# Patient Record
Sex: Male | Born: 1970 | Race: Black or African American | Hispanic: No | Marital: Single | Smoking: Never smoker
Health system: Southern US, Community
[De-identification: ages and names within clinical notes are randomized; demographics above are authoritative.]

## PROBLEM LIST (undated history)

## (undated) HISTORY — PX: ANKLE SURGERY: SHX546

---

## 2006-07-29 DIAGNOSIS — Z0000 Encounter for general adult medical examination without abnormal findings: Secondary | ICD-10-CM | POA: Insufficient documentation

## 2006-07-29 DIAGNOSIS — Z309 Encounter for contraceptive management, unspecified: Secondary | ICD-10-CM | POA: Insufficient documentation

## 2006-07-29 DIAGNOSIS — K922 Gastrointestinal hemorrhage, unspecified: Secondary | ICD-10-CM | POA: Insufficient documentation

## 2006-08-19 DIAGNOSIS — E7800 Pure hypercholesterolemia, unspecified: Secondary | ICD-10-CM | POA: Insufficient documentation

## 2008-05-09 DIAGNOSIS — J029 Acute pharyngitis, unspecified: Secondary | ICD-10-CM | POA: Insufficient documentation

## 2012-10-18 ENCOUNTER — Ambulatory Visit: Payer: Commercial Indemnity | Attending: Orthopaedic Surgery | Admitting: Physical Therapy

## 2012-10-18 DIAGNOSIS — M25676 Stiffness of unspecified foot, not elsewhere classified: Secondary | ICD-10-CM | POA: Insufficient documentation

## 2012-10-18 DIAGNOSIS — M25673 Stiffness of unspecified ankle, not elsewhere classified: Secondary | ICD-10-CM | POA: Insufficient documentation

## 2012-10-18 DIAGNOSIS — IMO0001 Reserved for inherently not codable concepts without codable children: Secondary | ICD-10-CM | POA: Insufficient documentation

## 2012-10-18 DIAGNOSIS — R269 Unspecified abnormalities of gait and mobility: Secondary | ICD-10-CM | POA: Insufficient documentation

## 2012-10-18 DIAGNOSIS — M25579 Pain in unspecified ankle and joints of unspecified foot: Secondary | ICD-10-CM | POA: Insufficient documentation

## 2012-10-20 ENCOUNTER — Ambulatory Visit: Payer: Commercial Indemnity | Admitting: Physical Therapy

## 2012-10-24 ENCOUNTER — Ambulatory Visit: Payer: Commercial Indemnity | Admitting: Rehabilitation

## 2012-10-27 ENCOUNTER — Ambulatory Visit: Payer: Commercial Indemnity | Admitting: Physical Therapy

## 2012-10-31 ENCOUNTER — Ambulatory Visit: Payer: Commercial Indemnity | Admitting: Rehabilitation

## 2012-11-03 ENCOUNTER — Ambulatory Visit: Payer: Commercial Indemnity | Admitting: Rehabilitation

## 2012-11-07 ENCOUNTER — Ambulatory Visit: Payer: Commercial Indemnity | Attending: Orthopaedic Surgery | Admitting: Physical Therapy

## 2012-11-07 DIAGNOSIS — M25676 Stiffness of unspecified foot, not elsewhere classified: Secondary | ICD-10-CM | POA: Insufficient documentation

## 2012-11-07 DIAGNOSIS — IMO0001 Reserved for inherently not codable concepts without codable children: Secondary | ICD-10-CM | POA: Insufficient documentation

## 2012-11-07 DIAGNOSIS — M25579 Pain in unspecified ankle and joints of unspecified foot: Secondary | ICD-10-CM | POA: Insufficient documentation

## 2012-11-07 DIAGNOSIS — M25673 Stiffness of unspecified ankle, not elsewhere classified: Secondary | ICD-10-CM | POA: Insufficient documentation

## 2012-11-07 DIAGNOSIS — R269 Unspecified abnormalities of gait and mobility: Secondary | ICD-10-CM | POA: Insufficient documentation

## 2012-11-10 ENCOUNTER — Ambulatory Visit: Payer: Commercial Indemnity | Admitting: Rehabilitation

## 2012-11-14 ENCOUNTER — Ambulatory Visit: Payer: Commercial Indemnity | Admitting: Physical Therapy

## 2012-11-17 ENCOUNTER — Ambulatory Visit: Payer: Commercial Indemnity | Admitting: Physical Therapy

## 2012-11-21 ENCOUNTER — Ambulatory Visit: Payer: Commercial Indemnity | Admitting: Physical Therapy

## 2012-11-22 ENCOUNTER — Ambulatory Visit: Payer: Commercial Indemnity | Admitting: Physical Therapy

## 2012-11-24 ENCOUNTER — Ambulatory Visit: Payer: Commercial Indemnity | Admitting: Physical Therapy

## 2012-11-28 ENCOUNTER — Ambulatory Visit: Payer: Commercial Indemnity | Admitting: Physical Therapy

## 2012-11-30 ENCOUNTER — Other Ambulatory Visit (HOSPITAL_BASED_OUTPATIENT_CLINIC_OR_DEPARTMENT_OTHER): Payer: Self-pay | Admitting: Orthopaedic Surgery

## 2012-11-30 ENCOUNTER — Ambulatory Visit (HOSPITAL_BASED_OUTPATIENT_CLINIC_OR_DEPARTMENT_OTHER)
Admission: RE | Admit: 2012-11-30 | Discharge: 2012-11-30 | Disposition: A | Discharge status: Home / Self Care | Payer: Commercial Indemnity | Source: Ambulatory Visit | Attending: Orthopaedic Surgery | Admitting: Orthopaedic Surgery

## 2012-11-30 DIAGNOSIS — S8290XD Unspecified fracture of unspecified lower leg, subsequent encounter for closed fracture with routine healing: Secondary | ICD-10-CM | POA: Insufficient documentation

## 2012-11-30 DIAGNOSIS — M898X9 Other specified disorders of bone, unspecified site: Secondary | ICD-10-CM | POA: Insufficient documentation

## 2012-11-30 DIAGNOSIS — M25473 Effusion, unspecified ankle: Secondary | ICD-10-CM | POA: Insufficient documentation

## 2012-11-30 DIAGNOSIS — IMO0002 Reserved for concepts with insufficient information to code with codable children: Secondary | ICD-10-CM

## 2012-11-30 DIAGNOSIS — Z8781 Personal history of (healed) traumatic fracture: Secondary | ICD-10-CM | POA: Insufficient documentation

## 2012-11-30 DIAGNOSIS — S82899B Other fracture of unspecified lower leg, initial encounter for open fracture type I or II: Secondary | ICD-10-CM

## 2012-11-30 DIAGNOSIS — M25476 Effusion, unspecified foot: Secondary | ICD-10-CM | POA: Insufficient documentation

## 2012-11-30 DIAGNOSIS — R609 Edema, unspecified: Secondary | ICD-10-CM | POA: Insufficient documentation

## 2012-12-01 ENCOUNTER — Ambulatory Visit: Payer: Commercial Indemnity | Admitting: Physical Therapy

## 2012-12-05 ENCOUNTER — Ambulatory Visit: Payer: Commercial Indemnity | Attending: Orthopaedic Surgery | Admitting: Physical Therapy

## 2012-12-05 DIAGNOSIS — M25579 Pain in unspecified ankle and joints of unspecified foot: Secondary | ICD-10-CM | POA: Insufficient documentation

## 2012-12-05 DIAGNOSIS — R269 Unspecified abnormalities of gait and mobility: Secondary | ICD-10-CM | POA: Insufficient documentation

## 2012-12-05 DIAGNOSIS — M25673 Stiffness of unspecified ankle, not elsewhere classified: Secondary | ICD-10-CM | POA: Insufficient documentation

## 2012-12-05 DIAGNOSIS — IMO0001 Reserved for inherently not codable concepts without codable children: Secondary | ICD-10-CM | POA: Insufficient documentation

## 2012-12-05 DIAGNOSIS — M25676 Stiffness of unspecified foot, not elsewhere classified: Secondary | ICD-10-CM | POA: Insufficient documentation

## 2012-12-13 ENCOUNTER — Ambulatory Visit: Payer: Commercial Indemnity | Admitting: Physical Therapy

## 2012-12-15 ENCOUNTER — Ambulatory Visit: Payer: Commercial Indemnity | Admitting: Physical Therapy

## 2012-12-19 ENCOUNTER — Ambulatory Visit: Payer: Commercial Indemnity | Admitting: Physical Therapy

## 2012-12-21 ENCOUNTER — Ambulatory Visit: Payer: Commercial Indemnity | Admitting: Physical Therapy

## 2012-12-26 ENCOUNTER — Ambulatory Visit: Payer: Commercial Indemnity | Admitting: Rehabilitation

## 2012-12-28 ENCOUNTER — Ambulatory Visit: Payer: Commercial Indemnity | Admitting: Physical Therapy

## 2013-01-02 ENCOUNTER — Ambulatory Visit: Payer: Commercial Indemnity | Admitting: Physical Therapy

## 2013-01-04 ENCOUNTER — Ambulatory Visit: Payer: Commercial Indemnity | Admitting: Rehabilitation
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
## 2015-08-15 ENCOUNTER — Encounter (HOSPITAL_BASED_OUTPATIENT_CLINIC_OR_DEPARTMENT_OTHER): Payer: Self-pay | Admitting: *Deleted

## 2015-08-15 ENCOUNTER — Emergency Department (HOSPITAL_BASED_OUTPATIENT_CLINIC_OR_DEPARTMENT_OTHER): Payer: Managed Care, Other (non HMO)

## 2015-08-15 ENCOUNTER — Emergency Department (HOSPITAL_BASED_OUTPATIENT_CLINIC_OR_DEPARTMENT_OTHER)
Admission: EM | Admit: 2015-08-15 | Discharge: 2015-08-15 | Disposition: A | Discharge status: Home / Self Care | Payer: Managed Care, Other (non HMO) | Attending: Emergency Medicine | Admitting: Emergency Medicine

## 2015-08-15 DIAGNOSIS — W010XXA Fall on same level from slipping, tripping and stumbling without subsequent striking against object, initial encounter: Secondary | ICD-10-CM | POA: Diagnosis not present

## 2015-08-15 DIAGNOSIS — Y9289 Other specified places as the place of occurrence of the external cause: Secondary | ICD-10-CM | POA: Diagnosis not present

## 2015-08-15 DIAGNOSIS — Y998 Other external cause status: Secondary | ICD-10-CM | POA: Insufficient documentation

## 2015-08-15 DIAGNOSIS — S9002XA Contusion of left ankle, initial encounter: Secondary | ICD-10-CM | POA: Diagnosis not present

## 2015-08-15 DIAGNOSIS — W19XXXA Unspecified fall, initial encounter: Secondary | ICD-10-CM

## 2015-08-15 DIAGNOSIS — Y9389 Activity, other specified: Secondary | ICD-10-CM | POA: Diagnosis not present

## 2015-08-15 DIAGNOSIS — S93402A Sprain of unspecified ligament of left ankle, initial encounter: Secondary | ICD-10-CM | POA: Diagnosis not present

## 2015-08-15 DIAGNOSIS — S99912A Unspecified injury of left ankle, initial encounter: Secondary | ICD-10-CM | POA: Diagnosis present

## 2015-08-15 MED ORDER — OXYCODONE-ACETAMINOPHEN 5-325 MG PO TABS
1.0000 | ORAL_TABLET | Freq: Once | ORAL | Status: AC
  Administered 2015-08-15: 1 via ORAL
  Filled 2015-08-15: qty 1

## 2015-08-15 MED ORDER — OXYCODONE-ACETAMINOPHEN 7.5-325 MG PO TABS
1.0000 | ORAL_TABLET | Freq: Once | ORAL | Status: DC
  Filled 2015-08-15: qty 1

## 2015-08-15 NOTE — Discharge Instructions (Signed)
Ankle Sprain Follow-up with orthopedics. An ankle sprain is an injury to the strong, fibrous tissues (ligaments) that hold the bones of your ankle joint together.  CAUSES An ankle sprain is usually caused by a fall or by twisting your ankle. Ankle sprains most commonly occur when you step on the outer edge of your foot, and your ankle turns inward. People who participate in sports are more prone to these types of injuries.  SYMPTOMS   Pain in your ankle. The pain may be present at rest or only when you are trying to stand or walk.  Swelling.  Bruising. Bruising may develop immediately or within 1 to 2 days after your injury.  Difficulty standing or walking, particularly when turning corners or changing directions. DIAGNOSIS  Your caregiver will ask you details about your injury and perform a physical exam of your ankle to determine if you have an ankle sprain. During the physical exam, your caregiver will press on and apply pressure to specific areas of your foot and ankle. Your caregiver will try to move your ankle in certain ways. An X-ray exam may be done to be sure a bone was not broken or a ligament did not separate from one of the bones in your ankle (avulsion fracture).  TREATMENT  Certain types of braces can help stabilize your ankle. Your caregiver can make a recommendation for this. Your caregiver may recommend the use of medicine for pain. If your sprain is severe, your caregiver may refer you to a surgeon who helps to restore function to parts of your skeletal system (orthopedist) or a physical therapist. HOME CARE INSTRUCTIONS   Apply ice to your injury for 1-2 days or as directed by your caregiver. Applying ice helps to reduce inflammation and pain.  Put ice in a plastic bag.  Place a towel between your skin and the bag.  Leave the ice on for 15-20 minutes at a time, every 2 hours while you are awake.  Only take over-the-counter or prescription medicines for pain,  discomfort, or fever as directed by your caregiver.  Elevate your injured ankle above the level of your heart as much as possible for 2-3 days.  If your caregiver recommends crutches, use them as instructed. Gradually put weight on the affected ankle. Continue to use crutches or a cane until you can walk without feeling pain in your ankle.  If you have a plaster splint, wear the splint as directed by your caregiver. Do not rest it on anything harder than a pillow for the first 24 hours. Do not put weight on it. Do not get it wet. You may take it off to take a shower or bath.  You may have been given an elastic bandage to wear around your ankle to provide support. If the elastic bandage is too tight (you have numbness or tingling in your foot or your foot becomes cold and blue), adjust the bandage to make it comfortable.  If you have an air splint, you may blow more air into it or let air out to make it more comfortable. You may take your splint off at night and before taking a shower or bath. Wiggle your toes in the splint several times per day to decrease swelling. SEEK MEDICAL CARE IF:   You have rapidly increasing bruising or swelling.  Your toes feel extremely cold or you lose feeling in your foot.  Your pain is not relieved with medicine. SEEK IMMEDIATE MEDICAL CARE IF:  Your toes are  numb or blue.  You have severe pain that is increasing. MAKE SURE YOU:   Understand these instructions.  Will watch your condition.  Will get help right away if you are not doing well or get worse.   This information is not intended to replace advice given to you by your health care provider. Make sure you discuss any questions you have with your health care provider.   Document Released: 05/24/2005 Document Revised: 06/14/2014 Document Reviewed: 06/05/2011 Elsevier Interactive Patient Education [employer]
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
## 2015-08-15 NOTE — ED Notes (Signed)
He fell yesterday. Pain in his left ankle since the fall.

## 2015-08-19 ENCOUNTER — Encounter: Payer: Self-pay | Admitting: Family Medicine

## 2015-08-19 ENCOUNTER — Ambulatory Visit (INDEPENDENT_AMBULATORY_CARE_PROVIDER_SITE_OTHER): Payer: Self-pay | Admitting: Family Medicine

## 2015-08-19 VITALS — BP 128/72 | HR 78 | Ht 72.0 in | Wt 178.0 lb

## 2015-08-19 DIAGNOSIS — S99912A Unspecified injury of left ankle, initial encounter: Secondary | ICD-10-CM

## 2015-08-19 NOTE — Patient Instructions (Signed)
While you do have an ankle sprain your primary issue is a flare of your advanced arthritis in this ankle. Dr. Lajoyce Cornersuda and Dr. Victorino DikeHewitt are the specialists around here I would consider seeing if you get to the point you want something done surgically. These are the 4 classes of medicine you can take for this: Tylenol 500mg  1-2 tabs three times a day for pain. Aleve 1-2 tabs twice a day with food Glucosamine sulfate 750mg  twice a day is a supplement that may help. Capsaicin, aspercreme, or biofreeze topically up to four times a day may also help with pain. Cortisone injections are an option - call me if you want to do one of these. ACE wrap for compression and support. Consider the boot also in the short term. It's important that you continue to stay active. Consider physical therapy to strengthen muscles around the joint that hurts to take pressure off of the joint itself. Shoe inserts with good arch support may be helpful. Walker or cane if needed. Heat or ice 15 minutes at a time 3-4 times a day as needed to help with pain. Follow up with me in 1 month.

## 2015-08-21 DIAGNOSIS — S99912A Unspecified injury of left ankle, initial encounter: Secondary | ICD-10-CM | POA: Insufficient documentation

## 2015-08-21 NOTE — Assessment & Plan Note (Signed)
in setting of multiple surgeries.  Independently reviewed radiographs - has fusion of distal tibia and fibula, advanced DJD of ankle joint.  No ligamentous laxity on exam.  His pain is acute on chronic and he likely flared the advanced arthritis of this ankle.  Discussed tylenol, nsaids, glucosamine, topical medications.  Consider intraarticular injection.  ACE wrap for compression, boot only if needed.  Heat/ice.  F/u in 1 month.
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
## 2015-08-26 ENCOUNTER — Telehealth: Payer: Self-pay | Admitting: Family Medicine

## 2015-08-27 NOTE — Telephone Encounter (Signed)
He is on the schedule for an injection in the morning - we will discuss at that time.

## 2015-08-28 ENCOUNTER — Ambulatory Visit (INDEPENDENT_AMBULATORY_CARE_PROVIDER_SITE_OTHER): Payer: Managed Care, Other (non HMO) | Admitting: Family Medicine

## 2015-08-28 ENCOUNTER — Encounter: Payer: Self-pay | Admitting: Family Medicine

## 2015-08-28 VITALS — BP 134/85 | HR 74 | Ht 72.0 in | Wt 179.0 lb

## 2015-08-28 DIAGNOSIS — S99912A Unspecified injury of left ankle, initial encounter: Secondary | ICD-10-CM

## 2015-08-28 MED ORDER — METHYLPREDNISOLONE ACETATE 40 MG/ML IJ SUSP
40.0000 mg | Freq: Once | INTRAMUSCULAR | Status: AC
  Administered 2015-08-28: 40 mg via INTRA_ARTICULAR

## 2015-08-28 NOTE — Patient Instructions (Signed)
Call me in a week to let me know how you're doing. I will likely be in touch with you sooner though - I need to review your medical records then call you to talk about next steps, work status, referral.

## 2015-08-29 NOTE — Assessment & Plan Note (Signed)
in setting of multiple surgeries.  Independently reviewed radiographs - has fusion of distal tibia and fibula, advanced DJD of ankle joint.  No ligamentous laxity on exam.  His pain is acute on chronic and he likely flared the advanced arthritis of this ankle.  Intraarticular injection given today.  Discussed tylenol, nsaids, glucosamine, topical medications.  Consider intraarticular injection.  ACE wrap for compression, boot only if needed.  Heat/ice.  F/u in 1 month.  He is inquiring about a letter stating he is out of work due do disability.  He dropped off paperwork for me to review from his surgeons.  After informed written consent patient was seated on exam table.  Lateral aspect left ankle joint identified with ultrasound and marked.  Alcohol swab used to prep area then ankle joint injected with 2:1 marcaine: depomedrol.  Patient tolerated procedure well without immediate complications.

## 2015-08-29 NOTE — Progress Notes (Signed)
PCP: No PCP Per Patient  Subjective:   HPI: Patient is a 45 y.o. male here for left ankle injury.  3/14: Patient has history of severe left ankle fracture - s/p 5 surgeries. Never completely pain free after this. Recommendation from orthopedics around that time was ankle fusion following 'rebreaking' tibia and fibula (radiographs show fusion distally between the two) if he chose to pursue additional treatment. Then on 3/9 he was taking out the trash and inverted his left ankle. + swelling. Has been icing, elevating, taking aleve or advil, using topical medications, percocet. Pain level is 8/10, sharp.  3/23: Patient returns today for intraarticular ankle injection.  No past medical history on file.  No current outpatient prescriptions on file prior to visit.   No current facility-administered medications on file prior to visit.    Past Surgical History  Procedure Laterality Date  . Ankle surgery      No Known Allergies  Social History   Social History  . Marital Status: Single    Spouse Name: N/A  . Number of Children: N/A  . Years of Education: N/A   Occupational History  . Not on file.   Social History Main Topics  . Smoking status: Never Smoker   . Smokeless tobacco: Not on file  . Alcohol Use: No  . Drug Use: No  . Sexual Activity: Not on file   Other Topics Concern  . Not on file   Social History Narrative    No family history on file.  BP 134/85 mmHg  Pulse 74  Ht 6' (1.829 m)  Wt 179 lb (81.194 kg)  BMI 24.27 kg/m2  Review of Systems: See HPI above.    Objective:  Physical Exam:  Gen: NAD, comfortable in exam room  Exam not repeated today. Left ankle: Soft tissue swelling anteriorly lower leg (location of skin graft).  No bruising, other swelling around the ankle. Very limited motion all directions. TTP diffusely anterior ankle joint, fibula, tibia distally.  No other tenderness. Negative ant drawer and talar tilt.   Negative  syndesmotic compression. Thompsons test negative. NV intact distally.  Right ankle: FROM without pain.    Assessment & Plan:  1. Left ankle injury - in setting of multiple surgeries.  Independently reviewed radiographs - has fusion of distal tibia and fibula, advanced DJD of ankle joint.  No ligamentous laxity on exam.  His pain is acute on chronic and he likely flared the advanced arthritis of this ankle.  Intraarticular injection given today.  Discussed tylenol, nsaids, glucosamine, topical medications.  Consider intraarticular injection.  ACE wrap for compression, boot only if needed.  Heat/ice.  F/u in 1 month.  He is inquiring about a letter stating he is out of work due do disability.  He dropped off paperwork for me to review from his surgeons.  After informed written consent patient was seated on exam table.  Lateral aspect left ankle joint identified with ultrasound and marked.  Alcohol swab used to prep area then ankle joint injected with 2:1 marcaine: depomedrol.  Patient tolerated procedure well without immediate complications.

## 2015-09-04 ENCOUNTER — Encounter: Payer: Self-pay | Admitting: Family Medicine

## 2015-09-04 NOTE — Progress Notes (Signed)
Patient ID: Timothy Pace, male   DOB: 1970/11/15, 45 y.o.   MRN: [id number]  Patient dropped off paperwork pertaining to his left lower leg/ankle injuries and surgeries.  I reviewed these then discussed them with patient.  Initial injury was a left open pilon fracture.  He'd had Ladona Ridgelaylor spatial frame, external fixator, ORIF, I&D x 2, flap placement procedures done on this lower leg.  Last procedures performed 07/13/12 included tissue transfer, removal of 4 wires, removal of Taylor spatial frame, other hardware, and debridement of distal tibia.  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  Discussed possible future joint fusion in May 2014.  Ct scan from 11/30/2012 showed healing distal tibia fracture, healed distal fibula fracture.  His last visit with Dr. Renette ButtersGolden with orthopedics was 04/10/2013 - at this point patient's pain was still improving and radiographs showed continued fracture healing.  They discussed tibiotalar fusion for pain control though he opted not to move forward with this at that time.  Patient instructed me he then lost his insurance and did not see a physician for this up until his recent visit with ankle reinjury in the ED.  There is no mention of impairment, work status in records.  Based on records, his examination, limited motion, degree of arthritis I recommend he only work in a job where he is completing either sedentary or light duties.  He would only be able to stand/walk for approximately 15 minutes every 3-4 hours, a desk job would be strongly recommended.  Continue use of a cane or walker regularly to help with stability.  Regarding impairment - with or without an ankle fusion should he move forward with this - his greatest level of whole person impairment using functional measures would be 20% as he requires the use of a cane.  Using degree of arthritis, range of motion would be less than this and these cannot be combined.

## 2016-12-31 ENCOUNTER — Encounter: Payer: Self-pay | Admitting: Family Medicine

## 2016-12-31 ENCOUNTER — Ambulatory Visit (INDEPENDENT_AMBULATORY_CARE_PROVIDER_SITE_OTHER): Payer: Self-pay | Admitting: Family Medicine

## 2016-12-31 DIAGNOSIS — S99912D Unspecified injury of left ankle, subsequent encounter: Secondary | ICD-10-CM

## 2016-12-31 NOTE — Patient Instructions (Signed)
Your pain is due to arthritis. These are the different medications you can take for this: Tylenol 500mg  1-2 tabs three times a day for pain. Capsaicin, aspercreme, or biofreeze topically up to four times a day may also help with pain. Some supplements that may help for arthritis: Boswellia extract, curcumin, pycnogenol Aleve 1-2 tabs twice a day with food OR ibuprofen 800mg  three times a day with food. Cortisone injections are an option but these have not worked for you. Shoe inserts with good arch support may be helpful. Heat or ice 15 minutes at a time 3-4 times a day as needed to help with pain. Let me know if you want to see an ankle specialist to discuss fusion or pain physician. I'd encourage you to fill out the [facility] Coverage.

## 2017-01-01 NOTE — Progress Notes (Signed)
PCP: Patient, No Pcp Per  Subjective:   HPI: Patient is a 46 y.o. male here for left ankle injury.  3/14: Patient has history of severe left ankle fracture - s/p 5 surgeries. Never completely pain free after this. Recommendation from orthopedics around that time was ankle fusion following 'rebreaking' tibia and fibula (radiographs show fusion distally between the two) if he chose to pursue additional treatment. Then on 3/9 he was taking out the trash and inverted his left ankle. + swelling. Has been icing, elevating, taking aleve or advil, using topical medications, percocet. Pain level is 8/10, sharp.  08/28/15: Patient returns today for intraarticular ankle injection.  12/31/16: Patient reports continued problems with left ankle. Pain is 5/10 at rest but up to 10/10 and sharp by end of day anterior ankle. He takes advil or aleve, percocet. Tried some topical rubs also. Cortisone injection did not help - states in short term pain worsened. Pain worse with walking and standing. Despite restriction letter patient reports was denied disability and had to get a job at [employer] working about 4 hours a day - is standing. No new skin changes  No past medical history on file.  No current outpatient prescriptions on file prior to visit.   No current facility-administered medications on file prior to visit.     Past Surgical History:  Procedure Laterality Date  . ANKLE SURGERY      No Known Allergies  Social History   Social History  . Marital status: Single    Spouse name: N/A  . Number of children: N/A  . Years of education: N/A   Occupational History  . Not on file.   Social History Main Topics  . Smoking status: Never Smoker  . Smokeless tobacco: Never Used  . Alcohol use No  . Drug use: No  . Sexual activity: Not on file   Other Topics Concern  . Not on file   Social History Narrative  . No narrative on file    No family history on file.  BP 134/82   Pulse  84   Ht 6' (1.829 m)   Wt 165 lb (74.8 kg)   BMI 22.38 kg/m   Review of Systems: See HPI above.    Objective:  Physical Exam:  Gen: NAD, comfortable in exam room  Left ankle: Persistent soft tissue swelling anteriorly lower leg (location of skin graft).  No bruising, other swelling around the ankle.  Scars well healed. Very limited motion all directions. TTP diffusely anterior ankle joint, fibula, tibia distally.  No other tenderness. Negative ant drawer and talar tilt.   Negative syndesmotic compression. Thompsons test negative. NV intact distally.  Right ankle: FROM without pain.    Assessment & Plan:  1. Left ankle injury - in setting of multiple surgeries.  Radiographs last year showed fusion of distal tibia and fibula, advanced DJD of ankle joint.  No improvement following intraarticular injection of ankle.  He has tried aleve, advil, percocet, topical medications.  Discussed tylenol, nsaids, supplements, other topical medications.  Letter provided again.  Encouraged to fill out [facility] Coverage.  I think his options are limited unfortunately and he would benefit from ankle fusion though I suspect he will always live with some level of pain.

## 2017-01-01 NOTE — Assessment & Plan Note (Signed)
in setting of multiple surgeries.  Radiographs last year showed fusion of distal tibia and fibula, advanced DJD of ankle joint.  No improvement following intraarticular injection of ankle.  He has tried aleve, advil, percocet, topical medications.  Discussed tylenol, nsaids, supplements, other topical medications.  Letter provided again.  Encouraged to fill out [facility] Coverage.  I think his options are limited unfortunately and he would benefit from ankle fusion though I suspect he will always live with some level of pain.

## 2017-11-16 ENCOUNTER — Ambulatory Visit: Payer: BLUE CROSS/BLUE SHIELD | Admitting: Podiatry

## 2017-11-16 ENCOUNTER — Encounter: Payer: Self-pay | Admitting: Podiatry

## 2017-11-16 ENCOUNTER — Ambulatory Visit (INDEPENDENT_AMBULATORY_CARE_PROVIDER_SITE_OTHER): Payer: BLUE CROSS/BLUE SHIELD

## 2017-11-16 DIAGNOSIS — M19072 Primary osteoarthritis, left ankle and foot: Secondary | ICD-10-CM

## 2017-11-16 DIAGNOSIS — M779 Enthesopathy, unspecified: Secondary | ICD-10-CM

## 2017-11-16 MED ORDER — DICLOFENAC SODIUM 75 MG PO TBEC: 75.0000 mg | DELAYED_RELEASE_TABLET | Freq: Two times a day (BID) | ORAL | 1 refills | Status: DC

## 2017-11-16 MED ORDER — METHYLPREDNISOLONE 4 MG PO TBPK
ORAL_TABLET | ORAL | 0 refills | Status: DC
Start: 2017-11-16 — End: 2018-01-20

## 2017-11-16 NOTE — Progress Notes (Signed)
   HPI: 47 year old male with a history of a traumatic open ankle fracture to the left ankle approximately 5 years ago presents today for further evaluation and treatment.  He has had multiple surgeries to the left ankle.  Previous surgeons have told him that at one point he will need to have an ankle arthrodesis.  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  He currently is wearing an ankle brace and good supportive shoes however they have not alleviating symptoms.  Pain is currently 6/10.  He states that the pain keeps him up at night and he experiences tingling throbbing and burning sensations with swelling.  It is sore and tender to touch.  He presents today for further treatment and evaluation and a second opinion regarding best management for the left ankle  History reviewed. No pertinent past medical history.   Physical Exam: General: The patient is alert and oriented x3 in no acute distress.  Dermatology: Skin is warm, dry and supple bilateral lower extremities. Negative for open lesions or macerations.  Vascular: Palpable pedal pulses bilaterally. No edema or erythema noted. Capillary refill within normal limits.  Neurological: Epicritic and protective threshold grossly intact bilaterally.   Musculoskeletal Exam: There is approximately 10 degrees of plantarflexion of 5 degrees of dorsiflexion with a total range of motion of about 15 degrees to the left ankle.  Is very limited and painful with range of motion and to palpation.  Evidence of multiple muscle flaps and grafting around the left ankle.  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  End-stage DJD and degenerative changes, posttraumatic arthritis, noted to the left ankle joint.  There is some joint narrowing of the subtalar joint as well.  Assessment: 1.  Posttraumatic arthrosis of the left ankle   Plan of Care:  1. Patient  evaluated. X-Rays reviewed.  2.  I informed the patient that I do agree with prior surgeons that he will require at one point a tibiotalar arthrodesis of the left ankle joint.  In the meantime the patient can continue oral anti-inflammatories and ankle bracing to help support the ankle. 3.  Prescription for Medrol Dosepak 4.  Prescription for diclofenac 75 mg twice daily 5.  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 Peityn Payton, DPM [facility]  Dr. Felecia ShellingBrent M. Dhairya Corales, DPM    [address]                                        [place], [place] [address]                Office [phone]  Fax [phone]

## 2017-11-21 ENCOUNTER — Ambulatory Visit: Payer: BLUE CROSS/BLUE SHIELD | Admitting: Podiatry

## 2018-01-20 ENCOUNTER — Ambulatory Visit (INDEPENDENT_AMBULATORY_CARE_PROVIDER_SITE_OTHER): Payer: BLUE CROSS/BLUE SHIELD | Admitting: Podiatry

## 2018-01-20 ENCOUNTER — Encounter: Payer: Self-pay | Admitting: Podiatry

## 2018-01-20 DIAGNOSIS — M19072 Primary osteoarthritis, left ankle and foot: Secondary | ICD-10-CM

## 2018-01-20 MED ORDER — CELECOXIB 200 MG PO CAPS: 200.0000 mg | ORAL_CAPSULE | Freq: Two times a day (BID) | ORAL | 2 refills | Status: DC

## 2018-01-20 MED ORDER — TRAMADOL HCL 50 MG PO TABS: 50.0000 mg | ORAL_TABLET | Freq: Three times a day (TID) | ORAL | 0 refills | Status: DC | PRN

## 2018-01-20 NOTE — Progress Notes (Signed)
   HPI: 47 year old male presenting today for follow up evaluation of left ankle pain. He reports continue severe pain. Walking increases the pain which makes ambulation difficult for the patient. Taking the Medrol Dose Pak provided some temporary relief. Patient is here for further evaluation and treatment.   No past medical history on file.   Physical Exam: General: The patient is alert and oriented x3 in no acute distress.  Dermatology: Skin is warm, dry and supple bilateral lower extremities. Negative for open lesions or macerations.  Vascular: Palpable pedal pulses bilaterally. No edema or erythema noted. Capillary refill within normal limits.  Neurological: Epicritic and protective threshold grossly intact bilaterally.   Musculoskeletal Exam: There is approximately 10 degrees of plantarflexion of 5 degrees of dorsiflexion with a total range of motion of about 15 degrees to the left ankle.  Is very limited and painful with range of motion and to palpation.  Evidence of multiple muscle flaps and grafting around the left ankle.  Assessment: 1. Posttraumatic arthrosis of the left ankle   Plan of Care:  1. Patient evaluated.  2. Ankle brace dispensed.  3. Prescription for Celebrex 200 mg provided to patient.  4. Prescription for Tramadol 50 mg provided to patient.  5. Patient enrolling in short term disability in December to prepare for surgery.  6. Return to clinic in 6 weeks.   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 Jeromiah Ohalloran, DPM [facility]  Dr. Felecia ShellingBrent M. Cherylann Hobday, DPM    [address]                                        , [place] [address]                Office [phone]  Fax [phone]

## 2018-01-25 ENCOUNTER — Telehealth: Payer: Self-pay | Admitting: *Deleted

## 2018-01-25 MED ORDER — DICLOFENAC SODIUM 75 MG PO TBEC: 75.0000 mg | DELAYED_RELEASE_TABLET | Freq: Two times a day (BID) | ORAL | 0 refills | Status: DC

## 2018-01-25 NOTE — Telephone Encounter (Signed)
Refill request Diclofenac. Dr. Logan BoresEvans states refill once, and pt needs an appt prior to future refills.

## 2018-02-15 ENCOUNTER — Ambulatory Visit: Payer: BLUE CROSS/BLUE SHIELD | Admitting: Podiatry

## 2018-02-17 ENCOUNTER — Telehealth: Payer: Self-pay

## 2018-04-12 ENCOUNTER — Other Ambulatory Visit: Payer: Self-pay

## 2018-04-12 ENCOUNTER — Encounter: Payer: Self-pay | Admitting: Podiatry

## 2018-04-12 ENCOUNTER — Ambulatory Visit (INDEPENDENT_AMBULATORY_CARE_PROVIDER_SITE_OTHER): Payer: PRIVATE HEALTH INSURANCE | Admitting: Podiatry

## 2018-04-12 DIAGNOSIS — M19072 Primary osteoarthritis, left ankle and foot: Secondary | ICD-10-CM | POA: Diagnosis not present

## 2018-04-12 MED ORDER — HYDROCODONE-ACETAMINOPHEN 5-325 MG PO TABS: 1.0000 | ORAL_TABLET | Freq: Four times a day (QID) | ORAL | 0 refills | Status: DC | PRN

## 2018-04-12 NOTE — Patient Instructions (Signed)
Pre-Operative Instructions  Congratulations, you have decided to take an important step towards improving your quality of life.  You can be assured that the doctors and staff at [facility] will be with you every step of the way.  Here are some important things you should know:  1. Plan to be at the surgery center/hospital at least 1 (one) hour prior to your scheduled time, unless otherwise directed by the surgical center/hospital staff.  You must have a responsible adult accompany you, remain during the surgery and drive you home.  Make sure you have directions to the surgical center/hospital to ensure you arrive on time. 2. If you are having surgery at [facility] or [facility] hospitals, you will need a copy of your medical history and physical form from your family physician within one month prior to the date of surgery. We will give you a form for your primary physician to complete.  3. We make every effort to accommodate the date you request for surgery.  However, there are times where surgery dates or times have to be moved.  We will contact you as soon as possible if a change in schedule is required.   4. No aspirin/ibuprofen for one week before surgery.  If you are on aspirin, any non-steroidal anti-inflammatory medications (Mobic, Aleve, Ibuprofen) should not be taken seven (7) days prior to your surgery.  You make take Tylenol for pain prior to surgery.  5. Medications - If you are taking daily heart and blood pressure medications, seizure, reflux, allergy, asthma, anxiety, pain or diabetes medications, make sure you notify the surgery center/hospital before the day of surgery so they can tell you which medications you should take or avoid the day of surgery. 6. No food or drink after midnight the night before surgery unless directed otherwise by surgical center/hospital staff. 7. No alcoholic beverages 24-hours prior to surgery.  No smoking 24-hours prior or 24-hours after  surgery. 8. Wear loose pants or shorts. They should be loose enough to fit over bandages, boots, and casts. 9. Don't wear slip-on shoes. Sneakers are preferred. 10. Bring your boot with you to the surgery center/hospital.  Also bring crutches or a walker if your physician has prescribed it for you.  If you do not have this equipment, it will be provided for you after surgery. 11. If you have not been contacted by the surgery center/hospital by the day before your surgery, call to confirm the date and time of your surgery. 12. Leave-time from work may vary depending on the type of surgery you have.  Appropriate arrangements should be made prior to surgery with your employer. 13. Prescriptions will be provided immediately following surgery by your doctor.  Fill these as soon as possible after surgery and take the medication as directed. Pain medications will not be refilled on weekends and must be approved by the doctor. 14. Remove nail polish on the operative foot and avoid getting pedicures prior to surgery. 15. Wash the night before surgery.  The night before surgery wash the foot and leg well with water and the antibacterial soap provided. Be sure to pay special attention to beneath the toenails and in between the toes.  Wash for at least three (3) minutes. Rinse thoroughly with water and dry well with a towel.  Perform this wash unless told not to do so by your physician.  Enclosed: 1 Ice pack (please put in freezer the night before surgery)   1 Hibiclens skin cleaner     Pre-op instructions  If you have any questions regarding the instructions, please do not hesitate to call our office.  [place]: [address], [place], [place] [address] -- [phone]  [place]: [address] [place], [place] [address] -- [phone]  [place]: [address]  [place], [place] [address] -- [phone]  [place]: [address], [place], [place] [address] -- [phone]  Website:  [email or web address] 

## 2018-04-16 NOTE — Progress Notes (Signed)
   HPI: 47 year old male presenting today for follow up evaluation of left ankle pain. He reports continued soreness of the ankle. He reports continued swelling that is worse at the end of the day. He has been using the ankle brace which helps alleviate the pain. He states the Tramadol did not provided any relief but the Celebrex helps. He is interested in surgery after the first of the year. Patient is here for further evaluation and treatment.   No past medical history on file.   Physical Exam: General: The patient is alert and oriented x3 in no acute distress.  Dermatology: Skin is warm, dry and supple bilateral lower extremities. Negative for open lesions or macerations.  Vascular: Palpable pedal pulses bilaterally. No edema or erythema noted. Capillary refill within normal limits.  Neurological: Epicritic and protective threshold grossly intact bilaterally.   Musculoskeletal Exam: There is approximately 10 degrees of plantarflexion of 5 degrees of dorsiflexion with a total range of motion of about 15 degrees to the left ankle.  Is very limited and painful with range of motion and to palpation.  Evidence of multiple muscle flaps and grafting around the left ankle.  Assessment: 1. Posttraumatic arthrosis of the left ankle   Plan of Care:  1. Patient evaluated.  2. Today we discussed the conservative versus surgical management of the presenting pathology. The patient opts for surgical management. All possible complications and details of the procedure were explained. All patient questions were answered. No guarantees were expressed or implied. 3. Authorization for surgery was initiated today. Surgery will consist of left ankle arthrodesis.  4. Return to clinic one week post op.    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 Felecia Shelling, DPM    [address] [place], [place] [address]                 Office [phone]  Fax [phone]

## 2018-05-01 ENCOUNTER — Other Ambulatory Visit: Payer: Self-pay

## 2018-05-01 ENCOUNTER — Emergency Department (HOSPITAL_BASED_OUTPATIENT_CLINIC_OR_DEPARTMENT_OTHER)
Admission: EM | Admit: 2018-05-01 | Discharge: 2018-05-01 | Disposition: A | Discharge status: Home / Self Care | Payer: BLUE CROSS/BLUE SHIELD | Attending: Emergency Medicine | Admitting: Emergency Medicine

## 2018-05-01 ENCOUNTER — Encounter (HOSPITAL_BASED_OUTPATIENT_CLINIC_OR_DEPARTMENT_OTHER): Payer: Self-pay

## 2018-05-01 DIAGNOSIS — M791 Myalgia, unspecified site: Secondary | ICD-10-CM | POA: Insufficient documentation

## 2018-05-01 DIAGNOSIS — R5383 Other fatigue: Secondary | ICD-10-CM | POA: Diagnosis not present

## 2018-05-01 DIAGNOSIS — Z79899 Other long term (current) drug therapy: Secondary | ICD-10-CM | POA: Diagnosis not present

## 2018-05-01 DIAGNOSIS — R61 Generalized hyperhidrosis: Secondary | ICD-10-CM | POA: Diagnosis not present

## 2018-05-01 DIAGNOSIS — R52 Pain, unspecified: Secondary | ICD-10-CM

## 2018-05-01 NOTE — Discharge Instructions (Signed)
Please follow up with a primary doctor Return if you are worsening 

## 2018-05-01 NOTE — ED Triage Notes (Signed)
C/o body aches, chills-denies flu like sx-denies pain at present-NAD-steady gait

## 2018-05-01 NOTE — ED Provider Notes (Signed)
[facility] EMERGENCY DEPARTMENT Provider Note   CSN: [id number] Arrival date & time: 05/01/18  1540     History   Chief Complaint Chief Complaint  Patient presents with  . Generalized Body Aches    HPI Timothy Pace is a 47 y.o. male who presents with generalized body aches.  No significant past medical history.  The patient states that over the past 4 days he has had generalized body aches, joint pain, night sweats, fatigue.  His children were diagnosed with the flu and strep throat last week.  He states that he does not have a fever, URI symptoms, chest pain, shortness of breath, cough, abdominal pain, nausea, vomiting.  He does not have a primary care physician.  He is questioning whether or not he possibly has Lyme disease.  He states he was bit by a tick over the summer but has not had any recent tick bites.  He has never had a rash or illness after the tick bites.  He takes Celebrex daily for his chronic ankle pain and hydrocodone as needed.  HPI  History reviewed. No pertinent past medical history.  Patient Active Problem List   Diagnosis Date Noted  . Left ankle injury 08/21/2015    Past Surgical History:  Procedure Laterality Date  . ANKLE SURGERY          Home Medications    Prior to Admission medications   Medication Sig Start Date End Date Taking? Authorizing Provider  celecoxib (CELEBREX) 200 MG capsule Take 1 capsule (200 mg total) by mouth 2 (two) times daily. 01/20/18   Felecia ShellingEvans, Brent M, DPM  diazepam (VALIUM) 5 MG tablet TAKE 1-2 TABLETS BY MOUTH 45 MINUTES PRIOR TO DENTAL APPOINTMENT 03/08/18   [provider]  diclofenac (VOLTAREN) 75 MG EC tablet Take 1 tablet (75 mg total) by mouth 2 (two) times daily. 01/25/18   Felecia ShellingEvans, Brent M, DPM  HYDROcodone-acetaminophen (NORCO/VICODIN) 5-325 MG tablet Take 1 tablet by mouth every 6 (six) hours as needed for moderate pain. 04/12/18   Felecia ShellingEvans, Brent M, DPM  oxyCODONE (OXY IR/ROXICODONE) 5 MG  immediate release tablet TAKE 1 TABLET BY MOUTH EVERY 6 HOURS AS NEEDED FOR PAIN. DO NOT TAKE MORE THAN 6/24 HRS 03/09/18   [provider]  traMADol (ULTRAM) 50 MG tablet Take 1 tablet (50 mg total) by mouth every 8 (eight) hours as needed. 01/20/18   Felecia ShellingEvans, Brent M, DPM    Family History No family history on file.  Social History Social History   Tobacco Use  . Smoking status: Never Smoker  . Smokeless tobacco: Never Used  Substance Use Topics  . Alcohol use: Yes    Alcohol/week: 0.0 standard drinks    Comment: occ  . Drug use: No     Allergies   Patient has no known allergies.   Review of Systems Review of Systems  Constitutional: Positive for diaphoresis and fatigue. Negative for activity change, appetite change, fever and unexpected weight change.  HENT: Negative for congestion, rhinorrhea and sore throat.   Respiratory: Negative for cough and shortness of breath.   Cardiovascular: Negative for chest pain.  Gastrointestinal: Negative for abdominal pain, diarrhea, nausea and vomiting.  Genitourinary: Negative for dysuria.  Musculoskeletal: Positive for myalgias. Negative for back pain.  Neurological: Negative for weakness and headaches.  All other systems reviewed and are negative.    Physical Exam Updated Vital Signs BP 140/84 (BP Location: Left Arm)   Pulse 94   Temp 98.2 F (  36.8 C) (Oral)   Resp 18   Ht 5\' 11"  (1.803 m)   Wt 76.2 kg   SpO2 97%   BMI 23.43 kg/m   Physical Exam  Constitutional: He is oriented to person, place, and time. He appears well-developed and well-nourished. No distress.  Calm and cooperative.  HENT:  Head: Normocephalic and atraumatic.  Right Ear: Hearing, tympanic membrane, external ear and ear canal normal.  Left Ear: Hearing, tympanic membrane, external ear and ear canal normal.  Nose: Nose normal.  Mouth/Throat: Uvula is midline, oropharynx is clear and moist and mucous membranes are normal.  Eyes: Pupils are  equal, round, and reactive to light. Conjunctivae are normal. Right eye exhibits no discharge. Left eye exhibits no discharge. No scleral icterus.  Neck: Normal range of motion.  Cardiovascular: Normal rate and regular rhythm.  Pulmonary/Chest: Effort normal and breath sounds normal. No respiratory distress.  Abdominal: Soft. Bowel sounds are normal. He exhibits no distension. There is no tenderness.  Neurological: He is alert and oriented to person, place, and time.  Skin: Skin is warm and dry.  Psychiatric: He has a normal mood and affect. His behavior is normal.  Nursing note and vitals reviewed.    ED Treatments / Results  Labs (all labs ordered are listed, but only abnormal results are displayed) Labs Reviewed - No data to display  EKG None  Radiology No results found.  Procedures Procedures (including critical care time)  Medications Ordered in ED Medications - No data to display   Initial Impression / Assessment and Plan / ED Course  I have reviewed the triage vital signs and the nursing notes.  Pertinent labs & imaging results that were available during my care of the patient were reviewed by me and considered in my medical decision making (see chart for details).  47 year old male with constitutional symptoms for 4 days.  His vital signs are normal.  He is well-appearing.  Exam is unremarkable.  Unclear etiology but does not sound like the flu.  He is questioning whether or not he may have Lyme disease.  I advised him that we could obtain testing for this however he would need a primary care physician to follow-up with.  Encouraged him to establish care with primary care and to return if he was worsening.  Final Clinical Impressions(s) / ED Diagnoses   Final diagnoses:  Generalized body aches  Night sweats  Other fatigue    ED Discharge Orders    None       Bethel Born, PA-C 05/01/18 1746    Sabas Sous, MD 05/02/18 [phone]

## 2018-05-18 ENCOUNTER — Telehealth: Payer: Self-pay | Admitting: Podiatry

## 2018-05-18 NOTE — Telephone Encounter (Signed)
Pt called requesting refill of Hydrocodone. Pt is scheduled for surgery in January and is about out of his pain medication. Pt wanted to know if a refill could be sent in to his pharmacy.

## 2018-05-19 ENCOUNTER — Other Ambulatory Visit: Payer: Self-pay | Admitting: Podiatry

## 2018-05-19 MED ORDER — HYDROCODONE-ACETAMINOPHEN 5-325 MG PO TABS: 1.0000 | ORAL_TABLET | Freq: Four times a day (QID) | ORAL | 0 refills | Status: DC | PRN

## 2018-05-19 NOTE — Telephone Encounter (Signed)
Rx sent 

## 2018-05-19 NOTE — Progress Notes (Signed)
Chronic pain management

## 2018-07-07 ENCOUNTER — Telehealth: Payer: Self-pay | Admitting: *Deleted

## 2018-07-07 NOTE — Telephone Encounter (Signed)
"  I came by your office this morning to get with you.  They said you were with a patient.  I came by because I had a reminder that popped up in my email that I had a surgery scheduled for tomorrow.  I scheduled it I guess four months ago and I completely forgot about it.  I'd like to talk to Dr. Logan Bores about rescheduling it."

## 2018-08-07 ENCOUNTER — Ambulatory Visit (INDEPENDENT_AMBULATORY_CARE_PROVIDER_SITE_OTHER): Payer: BLUE CROSS/BLUE SHIELD

## 2018-08-07 ENCOUNTER — Ambulatory Visit (INDEPENDENT_AMBULATORY_CARE_PROVIDER_SITE_OTHER): Payer: PRIVATE HEALTH INSURANCE | Admitting: Podiatry

## 2018-08-07 ENCOUNTER — Encounter: Payer: Self-pay | Admitting: Podiatry

## 2018-08-07 ENCOUNTER — Other Ambulatory Visit: Payer: Self-pay

## 2018-08-07 DIAGNOSIS — M19072 Primary osteoarthritis, left ankle and foot: Secondary | ICD-10-CM | POA: Diagnosis not present

## 2018-08-07 MED ORDER — DICLOFENAC SODIUM 75 MG PO TBEC: 75.0000 mg | DELAYED_RELEASE_TABLET | Freq: Two times a day (BID) | ORAL | 1 refills | Status: DC

## 2018-08-07 MED ORDER — HYDROCODONE-ACETAMINOPHEN 5-325 MG PO TABS: 1.0000 | ORAL_TABLET | Freq: Four times a day (QID) | ORAL | 0 refills | Status: DC | PRN

## 2018-08-09 ENCOUNTER — Telehealth: Payer: Self-pay | Admitting: Podiatry

## 2018-08-09 NOTE — Telephone Encounter (Signed)
I informed pt it would take 3-5 days for the prior authorization for the voltaren and vicodin PA to go through if it had been received in our office. Pt states he will contact [facility].

## 2018-08-09 NOTE — Telephone Encounter (Signed)
Pt was seen on 08/07/18 and prescribed two medications but when he went to pick up the prescriptions the pharmacy stated they are waiting on prior authorization. Please give patient a call once received.

## 2018-08-12 NOTE — Progress Notes (Signed)
   HPI: 48 year old male presenting today for follow up evaluation of left ankle pain. He reports continued soreness of the ankle. He reports continued swelling that is worse at the end of the day. He has been using the ankle brace which helps alleviate the pain.  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  He has been waiting for insurance to proceed with surgery.  He was considering surgery last year but he had problems with his employer and insurance coverage.  No new complaints at this time.   No past medical history on file.   Physical Exam: General: The patient is alert and oriented x3 in no acute distress.  Dermatology: Skin is warm, dry and supple bilateral lower extremities. Negative for open lesions or macerations.  Vascular: Palpable pedal pulses bilaterally. No edema or erythema noted. Capillary refill within normal limits.  Neurological: Epicritic and protective threshold grossly intact bilaterally.   Musculoskeletal Exam: There is approximately 10 degrees of plantarflexion of 5 degrees of dorsiflexion with a total range of motion of about 15 degrees to the left ankle.  Is very limited and painful with range of motion and to palpation.  Evidence of multiple muscle flaps and grafting around the left ankle.  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  End-stage DJD and degenerative changes, posttraumatic arthritis, noted to the left ankle joint.  There is some joint narrowing of the subtalar joint as well.  No new findings compared to prior exam on 11/16/2017  Assessment: 1. Posttraumatic arthrosis of the left ankle   Plan of Care:  1. Patient evaluated.  2.  Patient understands that at one point he would likely benefit from an ankle arthrodesis due to the significant amount of arthrosis of the ankle joint.  Patient currently states that the pain is tolerable and he would not like to  undergo surgery at this time.  He will reconsider once he has the insurance and financial means to take time off work.  He does understand this is going to be a long postoperative recovery 3.  Refill prescription for diclofenac 75 mg twice daily since this helps to alleviate some of his symptoms 4.  Refill prescription for Vicodin 5/325 mg as needed pain 5.  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 Felecia Shelling, DPM    [address] [place], [place] [address]                Office [phone]  Fax [phone]

## 2018-08-16 NOTE — Telephone Encounter (Signed)
Called pt about medication

## 2018-10-23 ENCOUNTER — Other Ambulatory Visit: Payer: Self-pay | Admitting: Podiatry

## 2018-11-06 ENCOUNTER — Encounter: Payer: Self-pay | Admitting: Podiatry

## 2018-11-06 ENCOUNTER — Other Ambulatory Visit: Payer: Self-pay

## 2018-11-06 ENCOUNTER — Ambulatory Visit (INDEPENDENT_AMBULATORY_CARE_PROVIDER_SITE_OTHER): Payer: PRIVATE HEALTH INSURANCE | Admitting: Podiatry

## 2018-11-06 VITALS — Temp 97.7°F

## 2018-11-06 DIAGNOSIS — M19072 Primary osteoarthritis, left ankle and foot: Secondary | ICD-10-CM

## 2018-11-06 MED ORDER — CELECOXIB 200 MG PO CAPS: 200.0000 mg | ORAL_CAPSULE | Freq: Two times a day (BID) | ORAL | 2 refills | Status: DC

## 2018-11-06 MED ORDER — HYDROCODONE-ACETAMINOPHEN 5-325 MG PO TABS: 1.0000 | ORAL_TABLET | Freq: Four times a day (QID) | ORAL | 0 refills | Status: DC | PRN

## 2018-11-06 NOTE — Progress Notes (Signed)
   HPI: 48 year old male presenting today for follow up evaluation of left ankle pain. He reports continued soreness of the ankle. He reports continued swelling that is worse at the end of the day. He has been using the ankle brace which helps alleviate the pain.  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  Patient states that he is eligible for a full-time position at [employer] beginning in July.  After being employed full-time he will be on a 1 year probation.  Patient would like to get through that 1 year probation before he proceeds with surgery.  History reviewed. No pertinent past medical history.   Physical Exam: General: The patient is alert and oriented x3 in no acute distress.  Dermatology: Skin is warm, dry and supple bilateral lower extremities. Negative for open lesions or macerations.  Vascular: Palpable pedal pulses bilaterally. No edema or erythema noted. Capillary refill within normal limits.  Neurological: Epicritic and protective threshold grossly intact bilaterally.   Musculoskeletal Exam: There is approximately 10 degrees of plantarflexion of 5 degrees of dorsiflexion with a total range of motion of about 15 degrees to the left ankle.  Is very limited and painful with range of motion and to palpation.  Evidence of multiple muscle flaps and grafting around the left ankle.   Assessment: 1. Posttraumatic arthrosis of the left ankle   Plan of Care:  1. Patient evaluated.  2.  Patient would like to have surgery in approximately 1 year.  Until then we will do everything we can to alleviate his symptoms. 3.  Prescription for Celebrex 100 mg twice daily.  Patient states that the diclofenac does not help. 4.  Prescription for Vicodin 5/325 mg #60 5.  Continue wearing good supportive shoes with ankle brace 6.  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 Felecia Shelling, DPM    [address] [place], [place] [address]                Office [phone]  Fax [phone]

## 2018-11-10 NOTE — Telephone Encounter (Signed)
Patient informed Dr. Logan Bores that he wants to wait until 2021 to have his surgery.

## 2019-02-14 ENCOUNTER — Telehealth: Payer: Self-pay | Admitting: Podiatry

## 2019-02-14 NOTE — Telephone Encounter (Signed)
Pt called requesting refill of hydrocodone 

## 2019-02-15 NOTE — Telephone Encounter (Signed)
Left message on pt's home phone to call back, I needed more information concerning the pain he was experiencing at this time to inform Dr. Amalia Hailey, and that Dr. Amalia Hailey was not in the office today but he would get his message.

## 2019-03-30 ENCOUNTER — Telehealth: Payer: Self-pay | Admitting: Podiatry

## 2019-03-30 NOTE — Telephone Encounter (Addendum)
I told pt I would send his request to Dr. Amalia Hailey. Pt states he was just able to go full time with his job and his insurance will be effective 1st of the year and he would like to schedule surgery then.

## 2019-03-30 NOTE — Telephone Encounter (Signed)
Pt requests refill of Hydrocodone. Uses [facility] [phone].

## 2019-04-01 ENCOUNTER — Other Ambulatory Visit: Payer: Self-pay | Admitting: Podiatry

## 2019-04-01 MED ORDER — HYDROCODONE-ACETAMINOPHEN 5-325 MG PO TABS: 1.0000 | ORAL_TABLET | Freq: Three times a day (TID) | ORAL | 0 refills | Status: DC | PRN

## 2019-04-01 NOTE — Telephone Encounter (Signed)
Rx sent 

## 2019-04-14 ENCOUNTER — Other Ambulatory Visit: Payer: Self-pay

## 2019-04-14 DIAGNOSIS — Z20822 Contact with and (suspected) exposure to covid-19: Secondary | ICD-10-CM

## 2019-04-15 LAB — NOVEL CORONAVIRUS, NAA: SARS-CoV-2, NAA: NOT DETECTED

## 2019-05-08 ENCOUNTER — Telehealth: Payer: Self-pay | Admitting: Podiatry

## 2019-05-08 NOTE — Telephone Encounter (Signed)
Pt called to see if he could get a renewal on handicap  Placard

## 2019-05-09 NOTE — Telephone Encounter (Signed)
I told pt that he had not been seen in office since 11/2018 and if he was still having a problem after 6 months, then he needed to be reevaluated. Pt states he is waiting for his insurance to go into effective 06/2019 to schedule surgery, and Dr. Amalia Hailey had said to call if he needed anything to call.

## 2019-05-15 NOTE — Telephone Encounter (Signed)
Okay to renew temporary handicap placard. Patient's condition is chronic.  - Dr. Amalia Hailey

## 2019-06-26 ENCOUNTER — Other Ambulatory Visit: Payer: Self-pay

## 2019-06-26 ENCOUNTER — Ambulatory Visit (INDEPENDENT_AMBULATORY_CARE_PROVIDER_SITE_OTHER): Payer: PRIVATE HEALTH INSURANCE | Admitting: Podiatry

## 2019-06-26 ENCOUNTER — Encounter: Payer: Self-pay | Admitting: Podiatry

## 2019-06-26 DIAGNOSIS — M19072 Primary osteoarthritis, left ankle and foot: Secondary | ICD-10-CM | POA: Diagnosis not present

## 2019-06-26 MED ORDER — HYDROCODONE-ACETAMINOPHEN 5-325 MG PO TABS: 1.0000 | ORAL_TABLET | Freq: Three times a day (TID) | ORAL | 0 refills | Status: DC | PRN

## 2019-06-28 ENCOUNTER — Telehealth: Payer: Self-pay

## 2019-06-28 DIAGNOSIS — M19072 Primary osteoarthritis, left ankle and foot: Secondary | ICD-10-CM

## 2019-06-28 NOTE — Progress Notes (Signed)
   HPI: 49 year old male presenting today for follow up evaluation of left ankle pain. He states he still has pain but it is not as severe. He has been taking Celebrex for treatment. Being on the ankle for long periods of time increases the pain. Patient is here for further evaluation and treatment.   No past medical history on file.   Physical Exam: General: The patient is alert and oriented x3 in no acute distress.  Dermatology: Skin is warm, dry and supple bilateral lower extremities. Negative for open lesions or macerations.  Vascular: Palpable pedal pulses bilaterally. No edema or erythema noted. Capillary refill within normal limits.  Neurological: Epicritic and protective threshold grossly intact bilaterally.   Musculoskeletal Exam: There is approximately 10 degrees of plantarflexion of 5 degrees of dorsiflexion with a total range of motion of about 15 degrees to the left ankle.  Is very limited and painful with range of motion and to palpation.  Evidence of multiple muscle flaps and grafting around the left ankle.   Assessment: 1. Posttraumatic arthrosis of the left ankle   Plan of Care:  1. Patient evaluated.  2. Refill prescription for Celebrex 200 mg provided to patient.  3. Handicap parking placard renewed.  4. Refill prescription for Vicodin 5/325 mg #60 provided to patient. 5. Continue using ankle brace.  6. Return to clinic as needed for surgical consult.   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 Felecia Shelling, DPM    [address] [place], [place] [address]                Office [phone]  Fax [phone]

## 2019-06-28 NOTE — Telephone Encounter (Signed)
-----   Message from Felecia Shelling, DPM sent at 06/26/2019  3:12 PM EST ----- Regarding: Please refer to [facility] Please refer to  in [place] for a PCP appt.   Thanks, Dr. Logan Bores

## 2019-08-07 ENCOUNTER — Encounter (INDEPENDENT_AMBULATORY_CARE_PROVIDER_SITE_OTHER): Payer: Self-pay

## 2019-08-07 ENCOUNTER — Ambulatory Visit (INDEPENDENT_AMBULATORY_CARE_PROVIDER_SITE_OTHER): Payer: BC Managed Care – PPO | Admitting: Family

## 2019-08-07 ENCOUNTER — Other Ambulatory Visit: Payer: Self-pay

## 2019-08-07 ENCOUNTER — Encounter: Payer: Self-pay | Admitting: Family

## 2019-08-07 VITALS — BP 118/78 | HR 80 | Temp 98.0°F | Ht 71.0 in | Wt 178.8 lb

## 2019-08-07 DIAGNOSIS — Z13220 Encounter for screening for lipoid disorders: Secondary | ICD-10-CM | POA: Diagnosis not present

## 2019-08-07 DIAGNOSIS — R5383 Other fatigue: Secondary | ICD-10-CM

## 2019-08-07 DIAGNOSIS — Z125 Encounter for screening for malignant neoplasm of prostate: Secondary | ICD-10-CM | POA: Diagnosis not present

## 2019-08-07 LAB — CBC WITH DIFFERENTIAL/PLATELET
Basophils Absolute: 0 10*3/uL (ref 0.0–0.1)
Basophils Relative: 0.2 % (ref 0.0–3.0)
Eosinophils Absolute: 0.1 10*3/uL (ref 0.0–0.7)
Eosinophils Relative: 2.9 % (ref 0.0–5.0)
HCT: 43.3 % (ref 39.0–52.0)
Hemoglobin: 14.4 g/dL (ref 13.0–17.0)
Lymphocytes Relative: 45.1 % (ref 12.0–46.0)
Lymphs Abs: 1.4 10*3/uL (ref 0.7–4.0)
MCHC: 33.3 g/dL (ref 30.0–36.0)
MCV: 94.7 fl (ref 78.0–100.0)
Monocytes Absolute: 0.4 10*3/uL (ref 0.1–1.0)
Monocytes Relative: 12.2 % — ABNORMAL HIGH (ref 3.0–12.0)
Neutro Abs: 1.3 10*3/uL — ABNORMAL LOW (ref 1.4–7.7)
Neutrophils Relative %: 39.6 % — ABNORMAL LOW (ref 43.0–77.0)
Platelets: 204 10*3/uL (ref 150.0–400.0)
RBC: 4.57 Mil/uL (ref 4.22–5.81)
RDW: 12.6 % (ref 11.5–15.5)
WBC: 3.2 10*3/uL — ABNORMAL LOW (ref 4.0–10.5)

## 2019-08-07 LAB — PSA: PSA: 0.52 ng/mL (ref 0.10–4.00)

## 2019-08-07 LAB — COMPREHENSIVE METABOLIC PANEL
ALT: 20 U/L (ref 0–53)
AST: 23 U/L (ref 0–37)
Albumin: 4.2 g/dL (ref 3.5–5.2)
Alkaline Phosphatase: 55 U/L (ref 39–117)
BUN: 13 mg/dL (ref 6–23)
CO2: 30 mEq/L (ref 19–32)
Calcium: 9.8 mg/dL (ref 8.4–10.5)
Chloride: 100 mEq/L (ref 96–112)
Creatinine, Ser: 1.09 mg/dL (ref 0.40–1.50)
GFR: 87.27 mL/min (ref >60.00)
Glucose, Bld: 99 mg/dL (ref 70–99)
Potassium: 5 mEq/L (ref 3.5–5.1)
Sodium: 137 mEq/L (ref 135–145)
Total Bilirubin: 0.9 mg/dL (ref 0.2–1.2)
Total Protein: 7.3 g/dL (ref 6.0–8.3)

## 2019-08-07 LAB — LIPID PANEL
Cholesterol: 220 mg/dL — ABNORMAL HIGH (ref 0–200)
HDL: 72.9 mg/dL (ref >39.00)
LDL Cholesterol: 139 mg/dL — ABNORMAL HIGH (ref 0–99)
NonHDL: 147.28
Total CHOL/HDL Ratio: 3
Triglycerides: 43 mg/dL (ref 0.0–149.0)
VLDL: 8.6 mg/dL (ref 0.0–40.0)

## 2019-08-07 MED ORDER — CELECOXIB 200 MG PO CAPS: 200.0000 mg | ORAL_CAPSULE | Freq: Two times a day (BID) | ORAL | 1 refills | Status: DC
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
## 2019-08-28 ENCOUNTER — Telehealth: Payer: Self-pay | Admitting: Family

## 2019-08-28 NOTE — Telephone Encounter (Signed)
Spoke with patient. 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 He called his job on Friday and was told to quarantine and contact his doctor's office. He wanted to give it a few days to see how he felt. He is still extremely sluggish, admits that his taste is somewhat gone and food taste like metallic. He is going to schedule COVID test tomorrow (pending appointments available) but will need note for work and having to quarantine until he gets his results back. He would like it emailed to e-mail on file in his chart.  Thanks

## 2019-08-28 NOTE — Telephone Encounter (Signed)
New message:  Pt is calling and states he thinks he may have covid. Pt states he needs a note for work. I have given the patient the number to call to get a test scheduled. Please advise.

## 2019-08-29 ENCOUNTER — Encounter: Payer: Self-pay | Admitting: Family

## 2019-08-29 ENCOUNTER — Ambulatory Visit: Payer: BC Managed Care – PPO | Attending: Internal Medicine

## 2019-08-29 DIAGNOSIS — Z20822 Contact with and (suspected) exposure to covid-19: Secondary | ICD-10-CM

## 2019-08-30 LAB — NOVEL CORONAVIRUS, NAA: SARS-CoV-2, NAA: NOT DETECTED

## 2019-08-30 LAB — SARS-COV-2, NAA 2 DAY TAT

## 2019-12-18 ENCOUNTER — Other Ambulatory Visit: Payer: Self-pay

## 2019-12-18 ENCOUNTER — Ambulatory Visit (INDEPENDENT_AMBULATORY_CARE_PROVIDER_SITE_OTHER): Payer: 59 | Admitting: Podiatry

## 2019-12-18 ENCOUNTER — Ambulatory Visit (INDEPENDENT_AMBULATORY_CARE_PROVIDER_SITE_OTHER): Payer: 59

## 2019-12-18 ENCOUNTER — Encounter: Payer: Self-pay | Admitting: *Deleted

## 2019-12-18 DIAGNOSIS — M19072 Primary osteoarthritis, left ankle and foot: Secondary | ICD-10-CM | POA: Diagnosis not present

## 2019-12-18 DIAGNOSIS — M7752 Other enthesopathy of left foot: Secondary | ICD-10-CM

## 2019-12-18 NOTE — Patient Instructions (Signed)
Pre-Operative Instructions  Congratulations, you have decided to take an important step towards improving your quality of life.  You can be assured that the doctors and staff at [facility] will be with you every step of the way.  Here are some important things you should know:  1. Plan to be at the surgery center/hospital at least 1 (one) hour prior to your scheduled time, unless otherwise directed by the surgical center/hospital staff.  You must have a responsible adult accompany you, remain during the surgery and drive you home.  Make sure you have directions to the surgical center/hospital to ensure you arrive on time. 2. If you are having surgery at [facility] or [facility], you will need a copy of your medical history and physical form from your family physician within one month prior to the date of surgery. We will give you a form for your primary physician to complete.  3. We make every effort to accommodate the date you request for surgery.  However, there are times where surgery dates or times have to be moved.  We will contact you as soon as possible if a change in schedule is required.   4. No aspirin/ibuprofen for one week before surgery.  If you are on aspirin, any non-steroidal anti-inflammatory medications (Mobic, Aleve, Ibuprofen) should not be taken seven (7) days prior to your surgery.  You make take Tylenol for pain prior to surgery.  5. Medications - If you are taking daily heart and blood pressure medications, seizure, reflux, allergy, asthma, anxiety, pain or diabetes medications, make sure you notify the surgery center/hospital before the day of surgery so they can tell you which medications you should take or avoid the day of surgery. 6. No food or drink after midnight the night before surgery unless directed otherwise by surgical center/hospital staff. 7. No alcoholic beverages 24-hours prior to surgery.  No smoking 24-hours prior or 24-hours after  surgery. 8. Wear loose pants or shorts. They should be loose enough to fit over bandages, boots, and casts. 9. Don't wear slip-on shoes. Sneakers are preferred. 10. Bring your boot with you to the surgery center/hospital.  Also bring crutches or a walker if your physician has prescribed it for you.  If you do not have this equipment, it will be provided for you after surgery. 11. If you have not been contacted by the surgery center/hospital by the day before your surgery, call to confirm the date and time of your surgery. 12. Leave-time from work may vary depending on the type of surgery you have.  Appropriate arrangements should be made prior to surgery with your employer. 13. Prescriptions will be provided immediately following surgery by your doctor.  Fill these as soon as possible after surgery and take the medication as directed. Pain medications will not be refilled on weekends and must be approved by the doctor. 14. Remove nail polish on the operative foot and avoid getting pedicures prior to surgery. 15. Wash the night before surgery.  The night before surgery wash the foot and leg well with water and the antibacterial soap provided. Be sure to pay special attention to beneath the toenails and in between the toes.  Wash for at least three (3) minutes. Rinse thoroughly with water and dry well with a towel.  Perform this wash unless told not to do so by your physician.  Enclosed: 1 Ice pack (please put in freezer the night before surgery)   1 Hibiclens skin cleaner     Pre-op instructions  If you have any questions regarding the instructions, please do not hesitate to call our office.  [place]: [address], [place], [place] [address] -- [phone]  [place]: [address] [place], [place] [address] -- [phone]  [place]: [address], [place], [place] [address] -- [phone]   Website: [email or web address] 

## 2019-12-18 NOTE — Progress Notes (Signed)
DG  °

## 2019-12-19 ENCOUNTER — Telehealth: Payer: Self-pay

## 2019-12-19 MED ORDER — HYDROCODONE-ACETAMINOPHEN 5-325 MG PO TABS: 1.0000 | ORAL_TABLET | Freq: Three times a day (TID) | ORAL | 0 refills | Status: DC | PRN

## 2019-12-19 NOTE — Progress Notes (Signed)
   HPI: 49 year old male presenting today for follow up evaluation of left ankle pain with advanced degenerative arthritis of the ankle joint secondary to an injury sustained several years back.  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  He has noticed a significant increase in pain over the past 2 months.  Patient states that he is now ready to move forward with surgery that we have been discussing over the past year   No past medical history on file.   Physical Exam: General: The patient is alert and oriented x3 in no acute distress.  Dermatology: Skin is warm, dry and supple bilateral lower extremities. Negative for open lesions or macerations.  Vascular: Palpable pedal pulses bilaterally. No edema or erythema noted. Capillary refill within normal limits.  Neurological: Epicritic and protective threshold grossly intact bilaterally.   Musculoskeletal Exam: There is approximately 10 degrees of plantarflexion of 5 degrees of dorsiflexion with a total range of motion of about 15 degrees to the left ankle.  Is very limited and painful with range of motion and to palpation.  Evidence of multiple muscle flaps and grafting around the left ankle.  Radiographic exam: No change since prior x-rays.  Advanced degenerative changes noted throughout the ankle joint mortise.  Normal osseous mineralization.  No fracture identified.   Assessment: 1. Posttraumatic arthritis of the left ankle 2.  Increased pain/capsulitis left ankle joint   Plan of Care:  1. Patient evaluated.  2.  Continue Celebrex 200 mg 2 times daily as needed 3. Refill prescription for Vicodin 5/325 mg #60 provided to patient. 5. Continue using ankle brace as needed.  6. Today we discussed the conservative versus surgical management of the presenting pathology. The patient opts for surgical management. All possible complications and details of the procedure  were explained. All patient questions were answered. No guarantees were expressed or implied. 3. Authorization for surgery was initiated today. Surgery will consist of ankle arthrodesis left 4.  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 Felecia Shelling, DPM    [address] [place], [place] [address]                Office [phone]  Fax [phone]

## 2019-12-19 NOTE — Telephone Encounter (Signed)
Received surgery paperwork from [place] office. Left message for [facility] to call and schedule surgery.

## 2020-01-01 ENCOUNTER — Telehealth: Payer: Self-pay | Admitting: *Deleted

## 2020-01-01 NOTE — Telephone Encounter (Signed)
Pt states he is to have surgery with Dr. Logan Bores on 01/24/2020 and would like to ask Dr. Logan Bores some questions.

## 2020-01-01 NOTE — Telephone Encounter (Signed)
Left message informing pt that if he had technical or medical questions about his surgery he would benefit from an appt to discuss with Dr. Logan Bores, if surgical coverage or pre-op questions he would need to contact the surgery coordinator [phone], and post op questions me [phone].

## 2020-01-10 ENCOUNTER — Emergency Department (HOSPITAL_BASED_OUTPATIENT_CLINIC_OR_DEPARTMENT_OTHER): Payer: 59

## 2020-01-10 ENCOUNTER — Emergency Department (HOSPITAL_BASED_OUTPATIENT_CLINIC_OR_DEPARTMENT_OTHER)
Admission: EM | Admit: 2020-01-10 | Discharge: 2020-01-10 | Disposition: A | Discharge status: Home / Self Care | Payer: 59 | Attending: Emergency Medicine | Admitting: Emergency Medicine

## 2020-01-10 ENCOUNTER — Other Ambulatory Visit: Payer: Self-pay

## 2020-01-10 ENCOUNTER — Encounter (HOSPITAL_BASED_OUTPATIENT_CLINIC_OR_DEPARTMENT_OTHER): Payer: Self-pay | Admitting: *Deleted

## 2020-01-10 DIAGNOSIS — M546 Pain in thoracic spine: Secondary | ICD-10-CM | POA: Diagnosis not present

## 2020-01-10 DIAGNOSIS — M549 Dorsalgia, unspecified: Secondary | ICD-10-CM

## 2020-01-10 DIAGNOSIS — R0789 Other chest pain: Secondary | ICD-10-CM | POA: Diagnosis present

## 2020-01-10 DIAGNOSIS — R0781 Pleurodynia: Secondary | ICD-10-CM

## 2020-01-10 LAB — COMPREHENSIVE METABOLIC PANEL
ALT: 24 U/L (ref 0–44)
AST: 20 U/L (ref 15–41)
Albumin: 4.4 g/dL (ref 3.5–5.0)
Alkaline Phosphatase: 54 U/L (ref 38–126)
Anion gap: 12 (ref 5–15)
BUN: 16 mg/dL (ref 6–20)
CO2: 25 mmol/L (ref 22–32)
Calcium: 9.3 mg/dL (ref 8.9–10.3)
Chloride: 102 mmol/L (ref 98–111)
Creatinine, Ser: 1.12 mg/dL (ref 0.61–1.24)
GFR calc Af Amer: >60 mL/min (ref >60)
GFR calc non Af Amer: >60 mL/min (ref >60)
Glucose, Bld: 97 mg/dL (ref 70–99)
Potassium: 3.6 mmol/L (ref 3.5–5.1)
Sodium: 139 mmol/L (ref 135–145)
Total Bilirubin: 0.7 mg/dL (ref 0.3–1.2)
Total Protein: 7.8 g/dL (ref 6.5–8.1)

## 2020-01-10 LAB — CBC WITH DIFFERENTIAL/PLATELET
Abs Immature Granulocytes: 0.02 10*3/uL (ref 0.00–0.07)
Basophils Absolute: 0 10*3/uL (ref 0.0–0.1)
Basophils Relative: 1 %
Eosinophils Absolute: 0.1 10*3/uL (ref 0.0–0.5)
Eosinophils Relative: 2 %
HCT: 42.9 % (ref 39.0–52.0)
Hemoglobin: 13.9 g/dL (ref 13.0–17.0)
Immature Granulocytes: 0 %
Lymphocytes Relative: 41 %
Lymphs Abs: 2.4 10*3/uL (ref 0.7–4.0)
MCH: 31 pg (ref 26.0–34.0)
MCHC: 32.4 g/dL (ref 30.0–36.0)
MCV: 95.5 fL (ref 80.0–100.0)
Monocytes Absolute: 0.5 10*3/uL (ref 0.1–1.0)
Monocytes Relative: 9 %
Neutro Abs: 2.8 10*3/uL (ref 1.7–7.7)
Neutrophils Relative %: 47 %
Platelets: 261 10*3/uL (ref 150–400)
RBC: 4.49 MIL/uL (ref 4.22–5.81)
RDW: 12 % (ref 11.5–15.5)
WBC: 5.9 10*3/uL (ref 4.0–10.5)
nRBC: 0 % (ref 0.0–0.2)

## 2020-01-10 LAB — TROPONIN I (HIGH SENSITIVITY): Troponin I (High Sensitivity): 3 ng/L (ref <18)

## 2020-01-10 MED ORDER — NAPROXEN 250 MG PO TABS
500.0000 mg | ORAL_TABLET | Freq: Once | ORAL | Status: AC
  Administered 2020-01-10: 500 mg via ORAL
  Filled 2020-01-10: qty 2

## 2020-01-10 NOTE — ED Triage Notes (Signed)
Pt c/o right sided chest pain x 2 days increased pain with movt

## 2020-01-10 NOTE — ED Provider Notes (Signed)
[facility] Provider Note: Lowella Dell, MD, FACEP  CSN: [id number] MRN: [id number] ARRIVAL: 01/10/20 at 0036 ROOM: [facility]   CHIEF COMPLAINT  Chest Pain   HISTORY OF PRESENT ILLNESS  01/10/20 5:22 AM Timothy Pace is a 49 y.o. male with a 2-day history of right lower chest pain.  The pain is well localized, pleuritic and he rated it as a 6 out of 10 on arrival.  He described it as a heaviness, worse with movement, deep breathing or coughing.  This has been associated with a right upper back pain which is also well localized.  This pain is worse with palpation or movement.  He denies any shortness of breath, cough or fever.  He denies any leg pain or swelling.  He has his left ankle in an orthotic device due to an old injury.  He is to have an ankle fusion in the coming months.  He denies any acute change in that ankle.   History reviewed. No pertinent past medical history.  Past Surgical History:  Procedure Laterality Date  . ANKLE SURGERY      No family history on file.  Social History   Tobacco Use  . Smoking status: Never Smoker  . Smokeless tobacco: Never Used  Vaping Use  . Vaping Use: Never used  Substance Use Topics  . Alcohol use: Yes    Alcohol/week: 0.0 standard drinks    Comment: occ  . Drug use: No    Prior to Admission medications   Medication Sig Start Date End Date Taking? Authorizing Provider  calcium carbonate (CALCIUM 600) 600 MG TABS tablet Take 600 mg by mouth 1 day or 1 dose.    [provider]  celecoxib (CELEBREX) 200 MG capsule Take 1 capsule (200 mg total) by mouth 2 (two) times daily. 08/07/19   Olive Bass, FNP  Cholecalciferol (VITAMIN D) 50 MCG (2000 UT) tablet Take by mouth.    [provider]  cyanocobalamin 1000 MCG tablet Take 1,000 mcg by mouth daily.    [provider]  HYDROcodone-acetaminophen (NORCO/VICODIN) 5-325 MG tablet Take 1 tablet by mouth every 8 (eight) hours as  needed for moderate pain. 12/19/19   Felecia Shelling, DPM    Allergies Patient has no known allergies.   REVIEW OF SYSTEMS  Negative except as noted here or in the History of Present Illness.   PHYSICAL EXAMINATION  Initial Vital Signs Blood pressure (!) 141/88, pulse 80, temperature 98.7 F (37.1 C), temperature source Oral, resp. rate 18, height 5\' 11"  (1.803 m), weight 81.2 kg, SpO2 97 %.  Examination General: Well-developed, well-nourished male in no acute distress; appearance consistent with age of record HENT: normocephalic; atraumatic Eyes: pupils equal, round and reactive to light; extraocular muscles intact Neck: supple Heart: regular rate and rhythm Lungs: clear to auscultation bilaterally Chest: Nontender Abdomen: soft; nondistended; nontender; bowel sounds present Back: Right upper paraspinal soft tissue tenderness Extremities: No deformity; normal range of motion except left ankle in orthotic device; no edema Neurologic: Awake, alert and oriented; motor function intact in all extremities and symmetric; no facial droop Skin: Warm and dry Psychiatric: Normal mood and affect   RESULTS  Summary of this visit's results, reviewed and interpreted by myself:   EKG Interpretation  Date/Time:  Thursday January 10 2020 00:43:32 EDT Ventricular Rate:  96 PR Interval:  130 QRS Duration: 82 QT Interval:  360 QTC Calculation: 454 R Axis:   50 Text Interpretation: Sinus rhythm with Premature  atrial complexes Otherwise normal ECG No previous ECGs available Confirmed by Joliyah Lippens, Jonny Ruiz ([id number]) on 01/10/2020 1:02:24 AM      Laboratory Studies: Results for orders placed or performed during the hospital encounter of 01/10/20 (from the past 24 hour(s))  CBC with Differential     Status: None   Collection Time: 01/10/20  1:02 AM  Result Value Ref Range   WBC 5.9 4.0 - 10.5 K/uL   RBC 4.49 4.22 - 5.81 MIL/uL   Hemoglobin 13.9 13.0 - 17.0 g/dL   HCT 37.0 39 - 52 %   MCV 95.5 80.0  - 100.0 fL   MCH 31.0 26.0 - 34.0 pg   MCHC 32.4 30.0 - 36.0 g/dL   RDW 96.4 38.3 - 81.8 %   Platelets 261 150 - 400 K/uL   nRBC 0.0 0.0 - 0.2 %   Neutrophils Relative % 47 %   Neutro Abs 2.8 1.7 - 7.7 K/uL   Lymphocytes Relative 41 %   Lymphs Abs 2.4 0.7 - 4.0 K/uL   Monocytes Relative 9 %   Monocytes Absolute 0.5 0 - 1 K/uL   Eosinophils Relative 2 %   Eosinophils Absolute 0.1 0 - 0 K/uL   Basophils Relative 1 %   Basophils Absolute 0.0 0 - 0 K/uL   Immature Granulocytes 0 %   Abs Immature Granulocytes 0.02 0.00 - 0.07 K/uL  Comprehensive metabolic panel     Status: None   Collection Time: 01/10/20  1:02 AM  Result Value Ref Range   Sodium 139 135 - 145 mmol/L   Potassium 3.6 3.5 - 5.1 mmol/L   Chloride 102 98 - 111 mmol/L   CO2 25 22 - 32 mmol/L   Glucose, Bld 97 70 - 99 mg/dL   BUN 16 6 - 20 mg/dL   Creatinine, Ser 4.03 0.61 - 1.24 mg/dL   Calcium 9.3 8.9 - 75.4 mg/dL   Total Protein 7.8 6.5 - 8.1 g/dL   Albumin 4.4 3.5 - 5.0 g/dL   AST 20 15 - 41 U/L   ALT 24 0 - 44 U/L   Alkaline Phosphatase 54 38 - 126 U/L   Total Bilirubin 0.7 0.3 - 1.2 mg/dL   GFR calc non Af Amer >60 >60 mL/min   GFR calc Af Amer >60 >60 mL/min   Anion gap 12 5 - 15  Troponin I (High Sensitivity)     Status: None   Collection Time: 01/10/20  1:02 AM  Result Value Ref Range   Troponin I (High Sensitivity) 3 <18 ng/L   Imaging Studies: DG Chest 2 View  Result Date: 01/10/2020 CLINICAL DATA:  Chest pain EXAM: CHEST - 2 VIEW COMPARISON:  None. FINDINGS: The heart size and mediastinal contours are within normal limits. Both lungs are clear. The visualized skeletal structures are unremarkable. IMPRESSION: No active cardiopulmonary disease. Electronically Signed   By: Jasmine Pang M.D.   On: 01/10/2020 01:02    ED COURSE and MDM  Nursing notes, initial and subsequent vitals signs, including pulse oximetry, reviewed and interpreted by myself.  Vitals:   01/10/20 0040 01/10/20 0042 01/10/20 0043  01/10/20 0301  BP:  (!) 142/92  (!) 141/88  Pulse:  81  80  Resp:  16  18  Temp:   98.4 F (36.9 C) 98.7 F (37.1 C)  TempSrc:  Oral  Oral  SpO2:  99%  97%  Weight: 81.2 kg     Height: 5\' 11"  (1.803 m)  Medications  naproxen (NAPROSYN) tablet 500 mg (has no administration in time range)    PERC score -2.  Wells score 1 (due to immobilization of ankle), low probability of DVT.  The patient's chest pain is pleuritic in nature and his back pain is reproducible with palpation.  His EKG is normal except for some ectopic beats.  His rhythm strip was reviewed and no additional ectopy was seen during his visit.  I do not believe additional work-up is indicated at this time and his pain can be treated with an NSAID.  PROCEDURES  Procedures   ED DIAGNOSES     ICD-10-CM   1. Pleuritic chest pain  R07.81   2. Upper back pain on right side  M54.9        Shawndell Schillaci, Jonny Ruiz, MD 01/10/20 [phone]

## 2020-01-22 ENCOUNTER — Telehealth: Payer: Self-pay | Admitting: Podiatry

## 2020-01-22 NOTE — Telephone Encounter (Signed)
DOS: 01/24/2020  Ankle Arthrodesis Lt ([id number])  [employer] Effective 03.07.2021 -  Deductible: $100 with $0 remaining Out of Pocket: $1,000 with $119.46 met and $880.54 remaining. CoInsurance: 0% Copay: $0  Per [employer] website no prior authorization is required.   [employer] Effective 01.01.2021 -  Deductible: $0 Out of Pocket: $8,550 with $296.44 met and $8,253.56 remaining. CoInsurance: Copay:  Per [employer] no prior authorization is required. Reference# [id number].

## 2020-01-23 ENCOUNTER — Telehealth: Payer: Self-pay | Admitting: Podiatry

## 2020-01-23 NOTE — Telephone Encounter (Signed)
Called pt back regarding his message about canceling or rescheduling his sx. 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 Pt stated he has a consult with an orthopaedic doctor to see if he is a candidate for ankle replacement sx vs ankle fusion sx as he needs range of motion in his ankle due to his job if possible. I told the pt I would contact the sx center to cancel his sx and I'd let Dr. Logan Bores know as well. Told pt to call if he needs any appointments or anything from our office. Pt stated he loves [place] and Dr. Logan Bores is his hero and he would be back.  I called Aram Beecham at [facility] and canceled the sx with them. I've also canceled pt's sx on Dr. Logan Bores schedule in both the sx book and in Epic. I'm also canceling pt's postop appointments scheduled for Dr. Logan Bores.

## 2020-01-23 NOTE — Telephone Encounter (Signed)
I'm calling about rescheduling my sx that is scheduled for tomorrow. Thank you. Bye.

## 2020-02-01 ENCOUNTER — Encounter: Payer: BC Managed Care – PPO | Admitting: Podiatry

## 2020-02-08 ENCOUNTER — Encounter: Payer: BC Managed Care – PPO | Admitting: Podiatry

## 2020-02-22 ENCOUNTER — Encounter: Payer: BC Managed Care – PPO | Admitting: Podiatry

## 2020-08-27 ENCOUNTER — Ambulatory Visit: Payer: BC Managed Care – PPO | Admitting: Podiatry

## 2020-08-29 ENCOUNTER — Ambulatory Visit: Payer: BC Managed Care – PPO | Admitting: Podiatry

## 2020-09-02 ENCOUNTER — Ambulatory Visit (INDEPENDENT_AMBULATORY_CARE_PROVIDER_SITE_OTHER): Payer: BC Managed Care – PPO | Admitting: Podiatry

## 2020-09-02 ENCOUNTER — Other Ambulatory Visit: Payer: Self-pay

## 2020-09-02 DIAGNOSIS — M7752 Other enthesopathy of left foot: Secondary | ICD-10-CM

## 2020-09-02 DIAGNOSIS — M19072 Primary osteoarthritis, left ankle and foot: Secondary | ICD-10-CM

## 2020-09-02 MED ORDER — HYDROCODONE-ACETAMINOPHEN 5-325 MG PO TABS: 1.0000 | ORAL_TABLET | Freq: Every day | ORAL | 0 refills | Status: AC | PRN

## 2020-09-02 NOTE — Progress Notes (Signed)
   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  Over the past several months the patient has had 2 different consultations for second opinions regarding surgery to his ankle.  Both recommended arthrodesis over ankle joint implant.  He presents today for follow-up treatment evaluation  No past medical history on file.   Physical Exam: General: The patient is alert and oriented x3 in no acute distress.  Dermatology: Skin is warm, dry and supple bilateral lower extremities. Negative for open lesions or macerations.  Vascular: Palpable pedal pulses bilaterally. No edema or erythema noted. Capillary refill within normal limits.  Neurological: Epicritic and protective threshold grossly intact bilaterally.   Musculoskeletal Exam: There is approximately 10 degrees of plantarflexion of 5 degrees of dorsiflexion with a total range of motion of about 15 degrees to the left ankle.  Is very limited and painful with range of motion and to palpation.  Evidence of multiple muscle flaps and grafting around the left ankle   Assessment: 1. Posttraumatic arthritis of the left ankle 2.  Increased pain/capsulitis left ankle joint   Plan of Care:  1. Patient evaluated.  2.  Continue Celebrex 200 mg 2 times daily as needed 3. Refill prescription for Vicodin 5/325 mg #60 provided to patient. 5. Continue using ankle brace as needed.  6.  The patient understands now that he is not a candidate for ankle joint replacement since the ankle mortise is not in a rectus alignment and he is very young and active for his age.  He states that he discussed surgical options with 2 different surgeons and both recommended ankle arthrodesis. 7.  At the moment the  patient is not having any pain and doing well with only intermittent achiness.  For the moment we will postpone ankle arthrodesis as long as possible 8.  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 Felecia Shelling, DPM    [address] [place], [place] [address]                Office [phone]  Fax [phone]

## 2020-09-03 ENCOUNTER — Telehealth: Payer: Self-pay | Admitting: Podiatry

## 2020-09-03 ENCOUNTER — Other Ambulatory Visit: Payer: Self-pay | Admitting: Podiatry

## 2020-09-03 MED ORDER — CELECOXIB 200 MG PO CAPS: 200.0000 mg | ORAL_CAPSULE | Freq: Two times a day (BID) | ORAL | 1 refills | Status: AC

## 2020-09-03 NOTE — Telephone Encounter (Signed)
Patient has requested refill on Celebrex, Please Advise

## 2020-09-03 NOTE — Telephone Encounter (Signed)
done

## 2021-09-08 IMAGING — CR DG CHEST 2V
2 series · 2 of 2 positions shown · non-contrast
Comparison: None.

CLINICAL DATA: Chest pain

EXAM:
CHEST - 2 VIEW

[w chest pa]
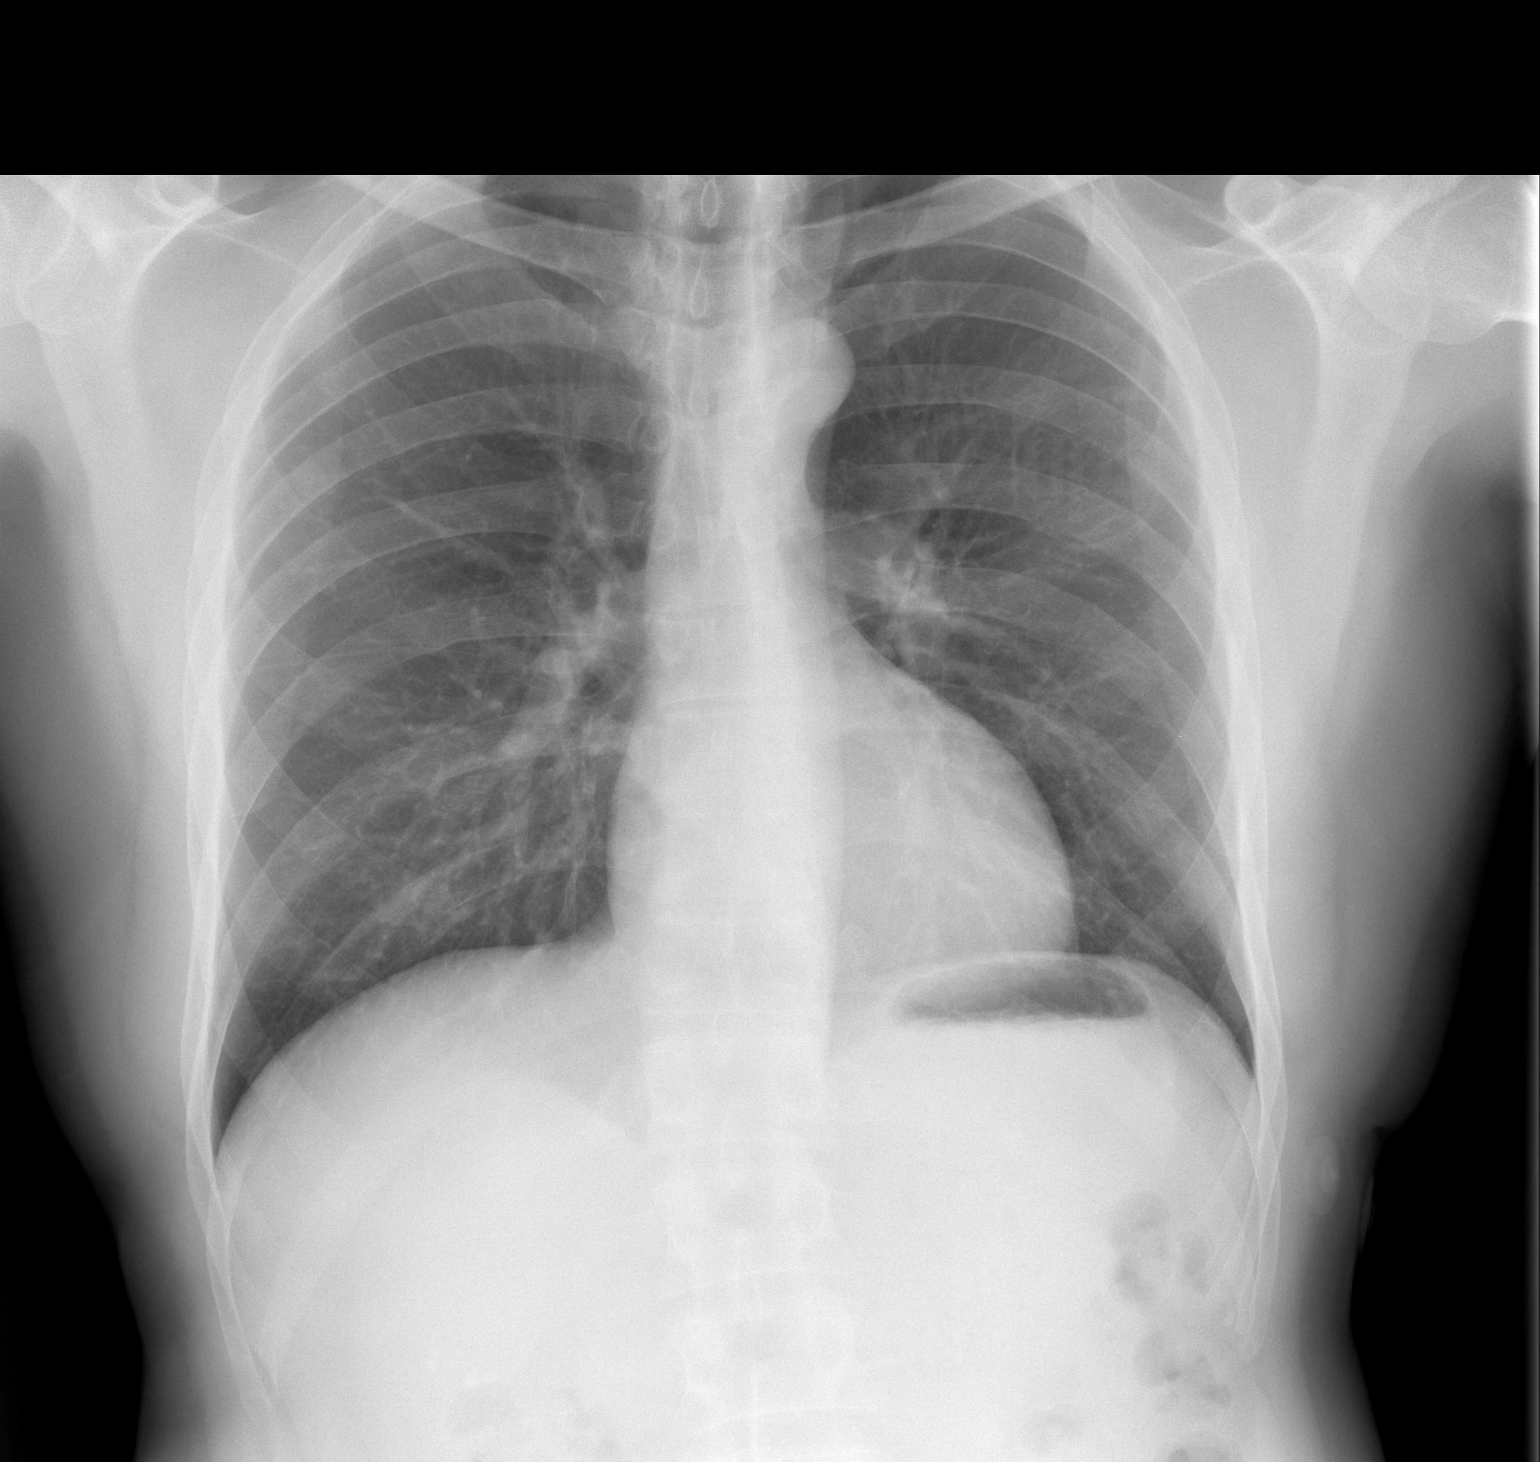

[w chest lat]
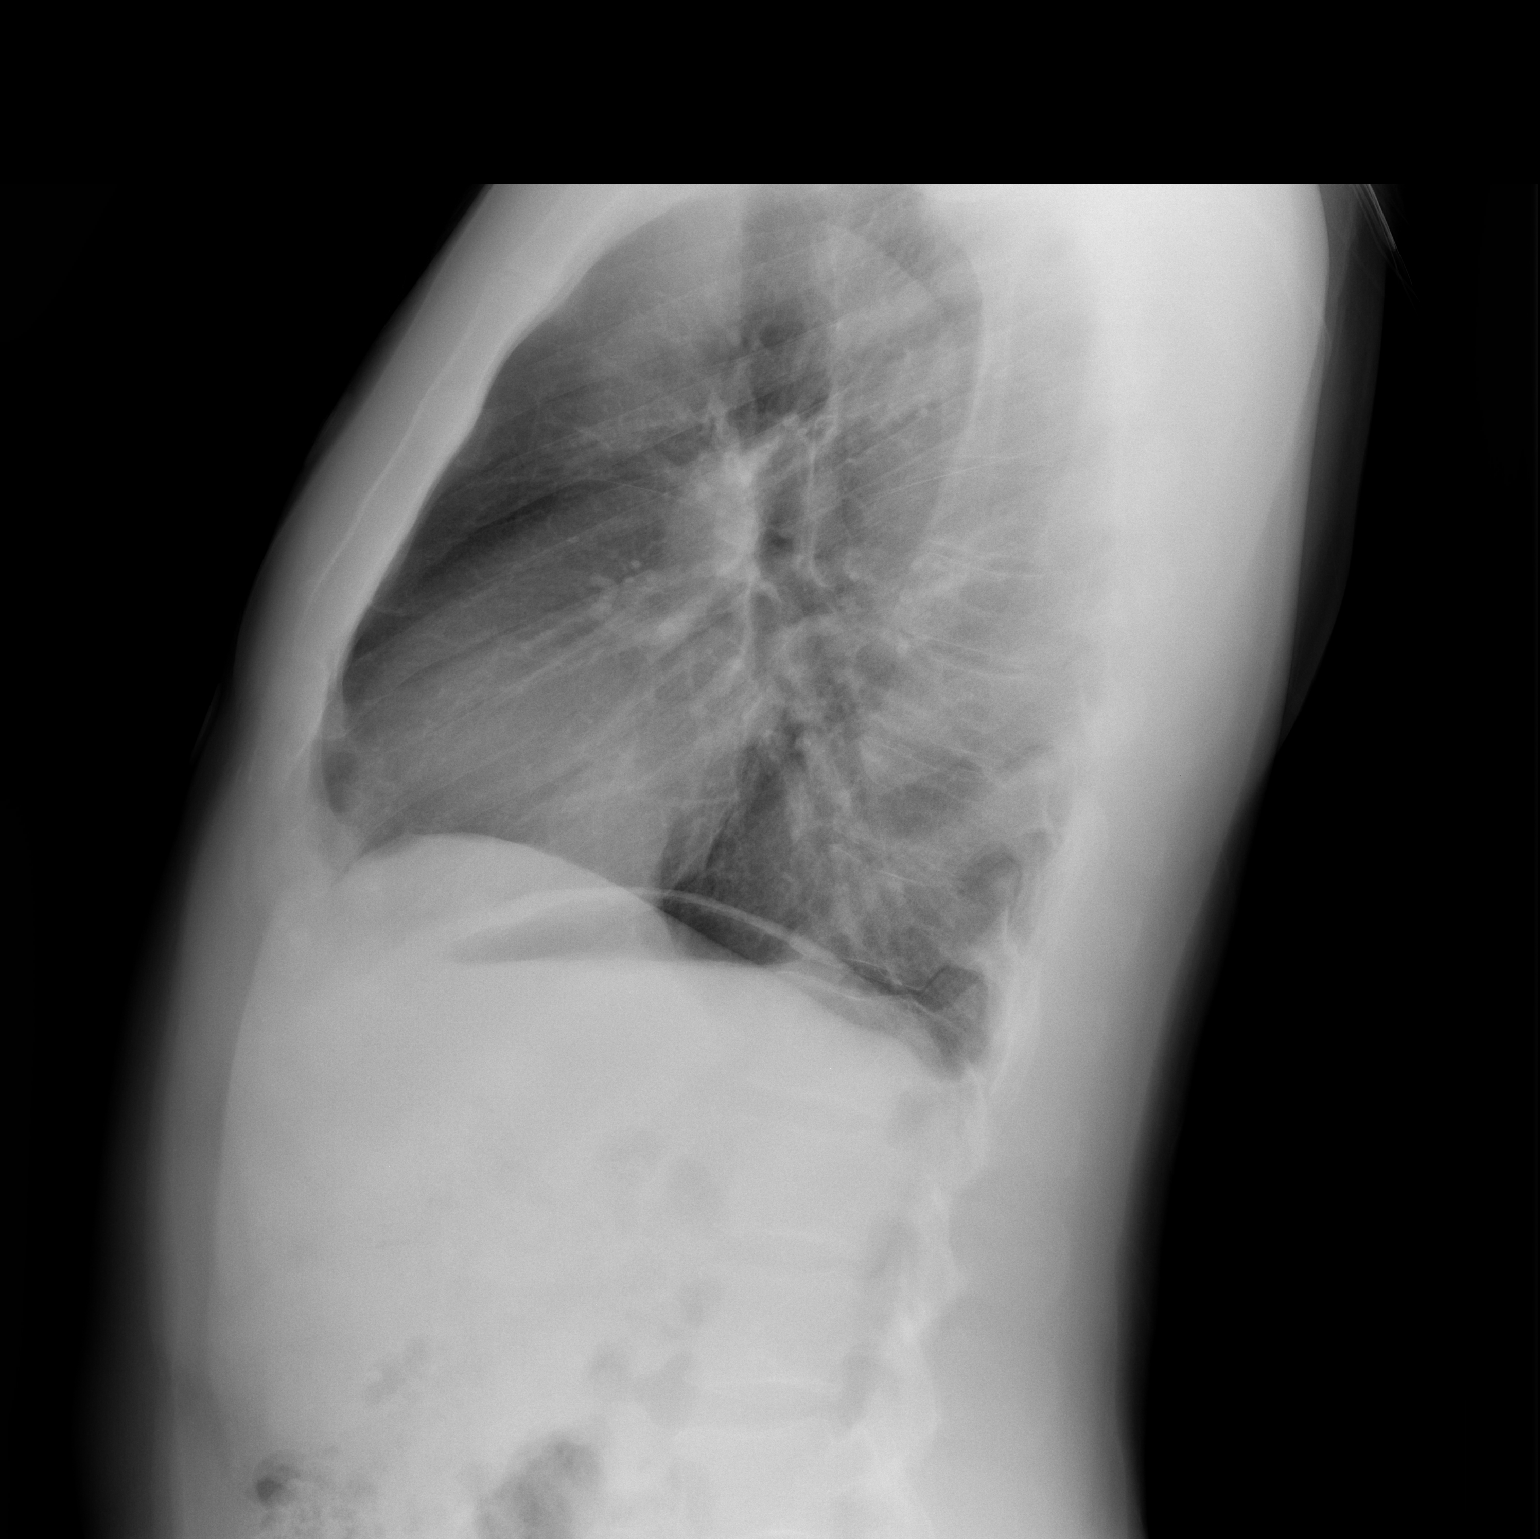

[2 of 2 positions shown; findings below may reference images not displayed]

FINDINGS: The heart size and mediastinal contours are within normal limits.
Both lungs are clear. The visualized skeletal structures are
unremarkable.
IMPRESSION: No active cardiopulmonary disease.

## 2022-08-23 ENCOUNTER — Ambulatory Visit (INDEPENDENT_AMBULATORY_CARE_PROVIDER_SITE_OTHER): Payer: BC Managed Care – PPO | Admitting: Podiatry

## 2022-08-23 DIAGNOSIS — M19072 Primary osteoarthritis, left ankle and foot: Secondary | ICD-10-CM | POA: Diagnosis not present

## 2022-08-23 NOTE — Progress Notes (Signed)
Patient came in today to just pick up an additional Tri-Lock ankle brace.  Patient states that his current ankle brace is worn out.  He preferred not to have an exam performed today.  Overall he is doing well with no new complaints.  Edrick Kins, DPM [facility]  Dr. Edrick Kins, DPM    [address], [place] [address]                Office [phone]  Fax [phone]
